# Patient Record
Sex: Female | Born: 1985 | Race: White | Hispanic: No | Marital: Single | Smoking: Former smoker
Health system: Southern US, Community
[De-identification: ages and names within clinical notes are randomized; demographics above are authoritative.]

## PROBLEM LIST (undated history)

## (undated) ENCOUNTER — Inpatient Hospital Stay (HOSPITAL_COMMUNITY): Payer: Self-pay

## (undated) DIAGNOSIS — K429 Umbilical hernia without obstruction or gangrene: Secondary | ICD-10-CM

## (undated) DIAGNOSIS — O00109 Unspecified tubal pregnancy without intrauterine pregnancy: Secondary | ICD-10-CM

## (undated) DIAGNOSIS — J020 Streptococcal pharyngitis: Secondary | ICD-10-CM

## (undated) DIAGNOSIS — R87619 Unspecified abnormal cytological findings in specimens from cervix uteri: Secondary | ICD-10-CM

## (undated) DIAGNOSIS — N83209 Unspecified ovarian cyst, unspecified side: Secondary | ICD-10-CM

## (undated) DIAGNOSIS — IMO0002 Reserved for concepts with insufficient information to code with codable children: Secondary | ICD-10-CM

## (undated) HISTORY — PX: LAPAROSCOPY FOR ECTOPIC PREGNANCY: SUR765

## (undated) HISTORY — PX: APPENDECTOMY: SHX54

## (undated) HISTORY — PX: LEEP: SHX91

---
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
## 2004-09-25 ENCOUNTER — Other Ambulatory Visit: Admission: RE | Admit: 2004-09-25 | Discharge: 2004-09-25 | Payer: Self-pay | Admitting: Obstetrics and Gynecology

## 2004-12-23 ENCOUNTER — Inpatient Hospital Stay (HOSPITAL_COMMUNITY): Admission: AD | Admit: 2004-12-23 | Discharge: 2004-12-23 | Payer: Self-pay | Admitting: Obstetrics and Gynecology

## 2005-01-07 ENCOUNTER — Other Ambulatory Visit: Admission: RE | Admit: 2005-01-07 | Discharge: 2005-01-07 | Payer: Self-pay | Admitting: Obstetrics and Gynecology

## 2005-01-08 ENCOUNTER — Other Ambulatory Visit: Admission: RE | Admit: 2005-01-08 | Discharge: 2005-01-08 | Payer: Self-pay | Admitting: Obstetrics and Gynecology

## 2011-10-10 ENCOUNTER — Emergency Department (HOSPITAL_COMMUNITY): Payer: Self-pay

## 2011-10-10 ENCOUNTER — Emergency Department (HOSPITAL_COMMUNITY)
Admission: EM | Admit: 2011-10-10 | Discharge: 2011-10-11 | Disposition: A | Discharge status: Home / Self Care | Payer: Self-pay | Attending: Emergency Medicine | Admitting: Emergency Medicine

## 2011-10-10 ENCOUNTER — Encounter (HOSPITAL_COMMUNITY): Payer: Self-pay | Admitting: *Deleted

## 2011-10-10 DIAGNOSIS — F17200 Nicotine dependence, unspecified, uncomplicated: Secondary | ICD-10-CM | POA: Insufficient documentation

## 2011-10-10 DIAGNOSIS — N949 Unspecified condition associated with female genital organs and menstrual cycle: Secondary | ICD-10-CM | POA: Insufficient documentation

## 2011-10-10 DIAGNOSIS — R599 Enlarged lymph nodes, unspecified: Secondary | ICD-10-CM | POA: Insufficient documentation

## 2011-10-10 DIAGNOSIS — R5381 Other malaise: Secondary | ICD-10-CM | POA: Insufficient documentation

## 2011-10-10 DIAGNOSIS — J020 Streptococcal pharyngitis: Secondary | ICD-10-CM | POA: Insufficient documentation

## 2011-10-10 DIAGNOSIS — M542 Cervicalgia: Secondary | ICD-10-CM | POA: Insufficient documentation

## 2011-10-10 DIAGNOSIS — R109 Unspecified abdominal pain: Secondary | ICD-10-CM | POA: Insufficient documentation

## 2011-10-10 DIAGNOSIS — R102 Pelvic and perineal pain: Secondary | ICD-10-CM

## 2011-10-10 HISTORY — DX: Streptococcal pharyngitis: J02.0

## 2011-10-10 LAB — POCT PREGNANCY, URINE: Preg Test, Ur: NEGATIVE

## 2011-10-10 LAB — URINALYSIS, ROUTINE W REFLEX MICROSCOPIC
Glucose, UA: NEGATIVE mg/dL
Hgb urine dipstick: NEGATIVE
Specific Gravity, Urine: 1.022 (ref 1.005–1.030)
Urobilinogen, UA: 1 mg/dL (ref 0.0–1.0)

## 2011-10-10 MED ORDER — HYDROCODONE-ACETAMINOPHEN 7.5-500 MG/15ML PO SOLN: 15.0000 mL | Freq: Four times a day (QID) | ORAL | Status: AC | PRN

## 2011-10-10 MED ORDER — HYDROCODONE-ACETAMINOPHEN 5-325 MG PO TABS
1.0000 | ORAL_TABLET | Freq: Once | ORAL | Status: AC
  Administered 2011-10-10: 1 via ORAL
  Filled 2011-10-10: qty 1

## 2011-10-10 MED ORDER — AMOXICILLIN 500 MG PO CAPS: 500.0000 mg | ORAL_CAPSULE | Freq: Three times a day (TID) | ORAL | Status: AC

## 2011-10-10 MED ORDER — DEXAMETHASONE SODIUM PHOSPHATE 10 MG/ML IJ SOLN
10.0000 mg | Freq: Once | INTRAMUSCULAR | Status: AC
  Administered 2011-10-10: 10 mg via INTRAMUSCULAR
  Filled 2011-10-10: qty 1

## 2011-10-10 NOTE — Discharge Instructions (Signed)
Strep Infections Streptococcal (strep) infections are caused by streptococcal germs (bacteria). Strep infections are very contagious. Strep infections can occur in:  Ears.   The nose.   The throat.   Sinuses.   Skin.   Blood.   Lungs.   Spinal fluid.   Urine.  Strep throat is the most common bacterial infection in children. The symptoms of a Strep infection usually get better in 2 to 3 days after starting medicine that kills germs (antibiotics). Strep is usually not contagious after 36 to 48 hours of antibiotic treatment. Strep infections that are not treated can cause serious complications. These include gland infections, throat abscess, rheumatic fever and kidney disease. DIAGNOSIS  The diagnosis of strep is made by:  A culture for the strep germ.  TREATMENT  These infections require oral antibiotics for a full 10 days, an antibiotic shot or antibiotics given into the vein (intravenous, IV). HOME CARE INSTRUCTIONS   Be sure to finish all antibiotics even if feeling better.   Only take over-the-counter medicines for pain, discomfort and or fever, as directed by your caregiver.   Close contacts that have a fever, sore throat or illness symptoms should see their caregiver right away.   You or your child may return to work, school or daycare if the fever and pain are better in 2 to 3 days after starting antibiotics.  SEEK MEDICAL CARE IF:   You or your child has an oral temperature above 102 F (38.9 C).   Your baby is older than 3 months with a rectal temperature of 100.5 F (38.1 C) or higher for more than 1 day.   You or your child is not better in 3 days.  SEEK IMMEDIATE MEDICAL CARE IF:   You or your child has an oral temperature above 102 F (38.9 C), not controlled by medicine.   Your baby is older than 3 months with a rectal temperature of 102 F (38.9 C) or higher.   Your baby is 21 months old or younger with a rectal temperature of 100.4 F (38 C) or  higher.   There is a spreading rash.   There is difficulty swallowing or breathing.   There is increased pain or swelling.  Document Released: 09/10/2004 Document Revised: 04/15/2011 Document Reviewed: 06/19/2009 [facility] Patient Information 2012 [place], [place].Salt Water Gargle This solution will help make your mouth and throat feel better. HOME CARE INSTRUCTIONS   Mix 1 teaspoon of salt in 8 ounces of warm water.   Gargle with this solution as much or often as you need or as directed. Swish and gargle gently if you have any sores or wounds in your mouth.   Do not swallow this mixture.  Document Released: 05/07/2004 Document Revised: 04/15/2011 Document Reviewed: 09/28/2008 [facility] Patient Information 2012 [place], [place].

## 2011-10-10 NOTE — ED Notes (Signed)
Lab called me to notify me that strep screen which was read as negative after has in fact now turned POSITIVE.   Further testing will be done

## 2011-10-10 NOTE — ED Provider Notes (Signed)
History     CSN: [id number]  Arrival date & time 10/10/11  1906   First MD Initiated Contact with Patient 10/10/11 2047      Chief Complaint  Patient presents with  . Sore Throat    (Consider location/radiation/quality/duration/timing/severity/associated sxs/prior treatment) HPI Comments: Patient here with two complaints - states that she has a history of strep pharyngitis and she thinks that she has it again - she is concerned because of the amount of pain she is having and she is concerned that she may have throat cancer - denies fever, chills, nausea, vomiting - reports pain with swallowing - also states that she has a history of ovarian cysts and thinks that she may have another one of those - states bilateral pelvic pain - denies fever, chills, vaginal discharge, dysuria, hematuria.  Patient is a 26 y.o. female presenting with pharyngitis and abdominal pain. The history is provided by the patient. No language interpreter was used.  Sore Throat This is a recurrent problem. The current episode started in the past 7 days. The problem occurs constantly. The problem has been rapidly worsening. Associated symptoms include abdominal pain, anorexia, fatigue, neck pain, a sore throat and swollen glands. Pertinent negatives include no arthralgias, change in bowel habit, chest pain, chills, congestion, coughing, diaphoresis, fever, headaches, joint swelling, myalgias, nausea, numbness, rash, urinary symptoms, vertigo, visual change, vomiting or weakness. The symptoms are aggravated by swallowing. She has tried nothing for the symptoms. The treatment provided no relief.  Abdominal Pain The primary symptoms of the illness include abdominal pain and fatigue. The primary symptoms of the illness do not include fever, shortness of breath, nausea, vomiting, diarrhea, hematemesis, hematochezia, dysuria, vaginal discharge or vaginal bleeding. The current episode started more than 2 days ago. The onset of the  illness was gradual. The problem has not changed since onset. The patient states that she believes she is currently not pregnant. The patient has not had a change in bowel habit. Additional symptoms associated with the illness include anorexia. Symptoms associated with the illness do not include chills, diaphoresis, heartburn, constipation, urgency, hematuria, frequency or back pain.    Past Medical History  Diagnosis Date  . Strep throat     Past Surgical History  Procedure Date  . Appendectomy     No family history on file.  History  Substance Use Topics  . Smoking status: Current Everyday Smoker -- 0.5 packs/day    Types: Cigarettes  . Smokeless tobacco: Not on file  . Alcohol Use: No    OB History    Grav Para Term Preterm Abortions TAB SAB Ect Mult Living                  Review of Systems  Constitutional: Positive for fatigue. Negative for fever, chills and diaphoresis.  HENT: Positive for sore throat, trouble swallowing and neck pain. Negative for congestion.   Respiratory: Negative for cough and shortness of breath.   Cardiovascular: Negative for chest pain.  Gastrointestinal: Positive for abdominal pain and anorexia. Negative for heartburn, nausea, vomiting, diarrhea, constipation, hematochezia, change in bowel habit and hematemesis.  Genitourinary: Positive for pelvic pain. Negative for dysuria, urgency, frequency, hematuria, vaginal bleeding and vaginal discharge.  Musculoskeletal: Negative for myalgias, back pain, joint swelling and arthralgias.  Skin: Negative for rash.  Neurological: Negative for vertigo, weakness, numbness and headaches.  All other systems reviewed and are negative.    Allergies  Review of patient's allergies indicates no known allergies.  Home  Medications   Current Outpatient Rx  Name Route Sig Dispense Refill  . EXCEDRIN EXTRA STRENGTH PO Oral Take 2 tablets by mouth daily as needed.      BP 119/58  Pulse 82  Temp(Src) 98.1 F  (36.7 C) (Oral)  Resp 16  SpO2 100%  LMP 09/14/2011  Physical Exam  Nursing note and vitals reviewed. Constitutional: She is oriented to person, place, and time. She appears well-developed and well-nourished. No distress.  HENT:  Head: Normocephalic and atraumatic.  Right Ear: External ear normal.  Left Ear: External ear normal.  Nose: Nose normal.  Mouth/Throat: No oropharyngeal exudate.       Mild posterior pharynx erythema  Eyes: Conjunctivae are normal. Pupils are equal, round, and reactive to light. No scleral icterus.  Neck: Normal range of motion. Neck supple.  Cardiovascular: Normal rate, regular rhythm and normal heart sounds.  Exam reveals no gallop and no friction rub.   No murmur heard. Pulmonary/Chest: Effort normal. No respiratory distress. She exhibits no tenderness.  Abdominal: Soft. Bowel sounds are normal. She exhibits no distension and no mass. There is tenderness in the suprapubic area. There is no rebound and no guarding.    Genitourinary: Vagina normal. There is no rash or tenderness on the right labia. There is no rash or tenderness on the left labia. Uterus is tender. Uterus is not deviated. Cervix exhibits no motion tenderness, no discharge and no friability. Right adnexum displays tenderness. Right adnexum displays no mass. Left adnexum displays tenderness. Left adnexum displays no mass. No tenderness around the vagina. No vaginal discharge found.  Musculoskeletal: Normal range of motion. She exhibits no edema and no tenderness.  Lymphadenopathy:    She has no cervical adenopathy.  Neurological: She is alert and oriented to person, place, and time. No cranial nerve deficit.  Skin: Skin is warm and dry. No rash noted. No erythema. No pallor.  Psychiatric: She has a normal mood and affect. Her behavior is normal. Judgment and thought content normal.    ED Course  Procedures (including critical care time)  Labs Reviewed  WET PREP, GENITAL - Abnormal;  Notable for the following:    WBC, Wet Prep HPF POC FEW (*)    All other components within normal limits  URINALYSIS, ROUTINE W REFLEX MICROSCOPIC - Abnormal; Notable for the following:    APPearance CLOUDY (*)    All other components within normal limits  RAPID STREP SCREEN  POCT PREGNANCY, URINE  STREP A DNA PROBE  GC/CHLAMYDIA PROBE AMP, GENITAL   US Transvaginal Non-ob  10/10/2011  *RADIOLOGY REPORT*  Clinical Data: Left pelvic pain.  TRANSABDOMINAL AND TRANSVAGINAL ULTRASOUND OF PELVIS  Technique:  Both transabdominal and transvaginal ultrasound examinations of the pelvis were performed including evaluation of the uterus, ovaries, adnexal regions, and pelvic cul-de-sac.  Comparison: None.  Findings:  Uterus: 8.2 x 4.6 x 5.3 cm.  Normal echotexture.  No focal abnormality.  Endometrium: Normal appearance and thickness, 10 mm.  Right Ovary: 4.1 x 2.7 x 4.3 cm.  Multiple small follicles. Normal size and echotexture.  No adnexal masses.  Left Ovary: 3.7 x 2.1 x 3.7 cm.  Multiple small follicles. Normal size and echotexture.  No adnexal masses.  Other Findings:  Trace free fluid in the pelvis.  IMPRESSION: Unremarkable study.  Original Report Authenticated By: Cyndie Chime, M.D.   US Pelvis Complete  10/10/2011  *RADIOLOGY REPORT*  Clinical Data: Left pelvic pain.  TRANSABDOMINAL AND TRANSVAGINAL ULTRASOUND OF PELVIS  Technique:  Both transabdominal and transvaginal ultrasound examinations of the pelvis were performed including evaluation of the uterus, ovaries, adnexal regions, and pelvic cul-de-sac.  Comparison: None.  Findings:  Uterus: 8.2 x 4.6 x 5.3 cm.  Normal echotexture.  No focal abnormality.  Endometrium: Normal appearance and thickness, 10 mm.  Right Ovary: 4.1 x 2.7 x 4.3 cm.  Multiple small follicles. Normal size and echotexture.  No adnexal masses.  Left Ovary: 3.7 x 2.1 x 3.7 cm.  Multiple small follicles. Normal size and echotexture.  No adnexal masses.  Other Findings:  Trace free  fluid in the pelvis.  IMPRESSION: Unremarkable study.  Original Report Authenticated By: Cyndie Chime, M.D.     Strep pharyngitis Pelvic pain    MDM  Patient here with sore throat - lab called and reports that the strep is actually positive - given an injection of steroids as well - also with pelvic pain - no discharge so I do not suspect PID, ultrasound without evidence of pelvic abnormalitty - will place on oral abx and pain medication for short course - she will follow up with ENT and own GYN        Jade Hancock C. [place], [place] 10/10/11 2349

## 2011-10-10 NOTE — ED Notes (Signed)
Pt with hx of strep in December is here with sore throat.  No sob, no fever or chills.

## 2011-10-11 LAB — STREP A DNA PROBE
Group A Strep Probe: POSITIVE
Special Requests: NORMAL

## 2011-10-13 LAB — GC/CHLAMYDIA PROBE AMP, GENITAL: GC Probe Amp, Genital: NEGATIVE

## 2011-10-15 NOTE — ED Provider Notes (Signed)
Medical screening examination/treatment/procedure(s) were performed by non-physician practitioner and as supervising physician I was immediately available for consultation/collaboration.  Shevon Sian, MD 10/15/11 1003 

## 2011-11-29 ENCOUNTER — Emergency Department (HOSPITAL_COMMUNITY)
Admission: EM | Admit: 2011-11-29 | Discharge: 2011-11-29 | Disposition: A | Discharge status: Home / Self Care | Payer: Self-pay | Attending: Emergency Medicine | Admitting: Emergency Medicine

## 2011-11-29 ENCOUNTER — Encounter (HOSPITAL_COMMUNITY): Payer: Self-pay | Admitting: *Deleted

## 2011-11-29 DIAGNOSIS — F17200 Nicotine dependence, unspecified, uncomplicated: Secondary | ICD-10-CM | POA: Insufficient documentation

## 2011-11-29 DIAGNOSIS — K0889 Other specified disorders of teeth and supporting structures: Secondary | ICD-10-CM

## 2011-11-29 DIAGNOSIS — K089 Disorder of teeth and supporting structures, unspecified: Secondary | ICD-10-CM | POA: Insufficient documentation

## 2011-11-29 MED ORDER — PENICILLIN V POTASSIUM 500 MG PO TABS: 500.0000 mg | ORAL_TABLET | Freq: Four times a day (QID) | ORAL | Status: AC

## 2011-11-29 MED ORDER — NAPROXEN 500 MG PO TABS: 500.0000 mg | ORAL_TABLET | Freq: Two times a day (BID) | ORAL | Status: DC

## 2011-11-29 MED ORDER — OXYCODONE-ACETAMINOPHEN 5-325 MG PO TABS
2.0000 | ORAL_TABLET | Freq: Once | ORAL | Status: AC
  Administered 2011-11-29: 2 via ORAL
  Filled 2011-11-29: qty 2

## 2011-11-29 NOTE — Discharge Instructions (Signed)
Dental Pain A tooth ache may be caused by cavities (tooth decay). Cavities expose the nerve of the tooth to air and hot or cold temperatures. It may come from an infection or abscess (also called a boil or furuncle) around your tooth. It is also often caused by dental caries (tooth decay). This causes the pain you are having. DIAGNOSIS  Your caregiver can diagnose this problem by exam. TREATMENT   If caused by an infection, it may be treated with medications which kill germs (antibiotics) and pain medications as prescribed by your caregiver. Take medications as directed.   Only take over-the-counter or prescription medicines for pain, discomfort, or fever as directed by your caregiver.   Whether the tooth ache today is caused by infection or dental disease, you should see your dentist as soon as possible for further care.  SEEK MEDICAL CARE IF: The exam and treatment you received today has been provided on an emergency basis only. This is not a substitute for complete medical or dental care. If your problem worsens or new problems (symptoms) appear, and you are unable to meet with your dentist, call or return to this location. SEEK IMMEDIATE MEDICAL CARE IF:   You have a fever.   You develop redness and swelling of your face, jaw, or neck.   You are unable to open your mouth.   You have severe pain uncontrolled by pain medicine.  MAKE SURE YOU:   Understand these instructions.   Will watch your condition.   Will get help right away if you are not doing well or get worse.  Document Released: 08/03/2005 Document Revised: 07/23/2011 Document Reviewed: 03/21/2008 [facility] Patient Information 2012 [place], [place].Dental Pain A tooth ache may be caused by cavities (tooth decay). Cavities expose the nerve of the tooth to air and hot or cold temperatures. It may come from an infection or abscess (also called a boil or furuncle) around your tooth. It is also often caused by dental caries (tooth  decay). This causes the pain you are having. DIAGNOSIS  Your caregiver can diagnose this problem by exam. TREATMENT   If caused by an infection, it may be treated with medications which kill germs (antibiotics) and pain medications as prescribed by your caregiver. Take medications as directed.   Only take over-the-counter or prescription medicines for pain, discomfort, or fever as directed by your caregiver.   Whether the tooth ache today is caused by infection or dental disease, you should see your dentist as soon as possible for further care.  SEEK MEDICAL CARE IF: The exam and treatment you received today has been provided on an emergency basis only. This is not a substitute for complete medical or dental care. If your problem worsens or new problems (symptoms) appear, and you are unable to meet with your dentist, call or return to this location. SEEK IMMEDIATE MEDICAL CARE IF:   You have a fever.   You develop redness and swelling of your face, jaw, or neck.   You are unable to open your mouth.   You have severe pain uncontrolled by pain medicine.  MAKE SURE YOU:   Understand these instructions.   Will watch your condition.   Will get help right away if you are not doing well or get worse.  Document Released: 08/03/2005 Document Revised: 07/23/2011 Document Reviewed: 03/21/2008 [facility] Patient Information 2012 [place], [place].Dental Pain A tooth ache may be caused by cavities (tooth decay). Cavities expose the nerve of the tooth to air and hot  or cold temperatures. It may come from an infection or abscess (also called a boil or furuncle) around your tooth. It is also often caused by dental caries (tooth decay). This causes the pain you are having. DIAGNOSIS  Your caregiver can diagnose this problem by exam. TREATMENT   If caused by an infection, it may be treated with medications which kill germs (antibiotics) and pain medications as prescribed by your caregiver. Take  medications as directed.   Only take over-the-counter or prescription medicines for pain, discomfort, or fever as directed by your caregiver.   Whether the tooth ache today is caused by infection or dental disease, you should see your dentist as soon as possible for further care.  SEEK MEDICAL CARE IF: The exam and treatment you received today has been provided on an emergency basis only. This is not a substitute for complete medical or dental care. If your problem worsens or new problems (symptoms) appear, and you are unable to meet with your dentist, call or return to this location. SEEK IMMEDIATE MEDICAL CARE IF:   You have a fever.   You develop redness and swelling of your face, jaw, or neck.   You are unable to open your mouth.   You have severe pain uncontrolled by pain medicine.  MAKE SURE YOU:   Understand these instructions.   Will watch your condition.   Will get help right away if you are not doing well or get worse.  Document Released: 08/03/2005 Document Revised: 07/23/2011 Document Reviewed: 03/21/2008 [facility] Patient Information 2012 [place], [place].  RESOURCE GUIDE  Dental Problems  Patients with Medicaid: [facility] [phone] [address]                                           [phone] Phone:  [phone]                                                  Phone:  [phone]  If unable to pay or uninsured, contact:  [facility] or [facility]. to become qualified for the adult dental clinic.  Chronic Pain Problems Contact [facility]  [phone] Patients need to be referred by their primary care doctor.  Insufficient Money for Medicine Contact [employer]:  call "211" or [facility] 606-826-9076.  No Primary Care Doctor Call [facility]  [phone] Other agencies that provide inexpensive medical care    [facility]  [phone]    [facility]  Internal Medicine  [phone]    [facility]  [phone]    [facility]  [phone]    [facility]  [phone]    [facility]  [phone]  Psychological Services [facility]  [phone] [facility]  [phone] [facility]   [phone] (emergency services [phone])  Substance Abuse Resources [facility]  [phone] [facility] [phone] [facility] [phone] Floydene Flock [phone] Residential & Outpatient Substance Abuse Program  587-318-5934  Abuse/Neglect [facility] Child Abuse Hotline 762-811-5836 [facility] Child Abuse  Hotline (734) 133-9551 (After Hours)  Emergency Shelter [facility] 803-068-9823  M[facility] [place] [facility] 3216995060 [facility] Services 440-318-4771  MRSA Hotline #:   (867)177-5325    [facility] Resources  [facility] [place]     [employer]                          [facility] Dept. [address] [place]                       [address]      [address]  Blondell Reveal Phone:  [phone]                                   Phone:  [phone]                 Phone:  [phone]  [facility] Mental Health Phone:  [phone]  [facility] Child Abuse Hotline (403)708-0937 531-311-7145 (After Hours)

## 2011-11-29 NOTE — ED Notes (Signed)
Toothache for 2 days worse tonight

## 2011-11-29 NOTE — ED Provider Notes (Signed)
History     CSN: [id number]  Arrival date & time 11/29/11  0114   First MD Initiated Contact with Patient 11/29/11 0355      Chief Complaint  Patient presents with  . Dental Pain    (Consider location/radiation/quality/duration/timing/severity/associated sxs/prior treatment) HPI Comments: Patient complains of upper left toothache which has been ongoing for the last 2 days. Symptoms are persistent, worse with chewing and temperature, not associated with fever chills nausea or vomiting. She has not seen a dentist for her symptoms. There were gradual in onset  Patient is a 26 y.o. female presenting with tooth pain. The history is provided by the patient.  Dental PainPrimary symptoms do not include fever or sore throat.  Additional symptoms do not include: facial swelling and trouble swallowing.    Past Medical History  Diagnosis Date  . Strep throat     Past Surgical History  Procedure Date  . Appendectomy     No family history on file.  History  Substance Use Topics  . Smoking status: Current Everyday Smoker -- 0.5 packs/day    Types: Cigarettes  . Smokeless tobacco: Not on file  . Alcohol Use: No    OB History    Grav Para Term Preterm Abortions TAB SAB Ect Mult Living                  Review of Systems  Constitutional: Negative for fever and chills.  HENT: Positive for dental problem. Negative for sore throat, facial swelling, trouble swallowing and voice change.        Toothache  Gastrointestinal: Negative for nausea and vomiting.    Allergies  Review of patient's allergies indicates no known allergies.  Home Medications   Current Outpatient Rx  Name Route Sig Dispense Refill  . IBUPROFEN 200 MG PO TABS Oral Take 800 mg by mouth every 8 (eight) hours as needed. For pain    . NAPROXEN 500 MG PO TABS Oral Take 1 tablet (500 mg total) by mouth 2 (two) times daily with a meal. 30 tablet 0  . PENICILLIN V POTASSIUM 500 MG PO TABS Oral Take 1 tablet (500 mg  total) by mouth 4 (four) times daily. 40 tablet 0    BP 114/75  Pulse 72  Temp(Src) 98.1 F (36.7 C) (Oral)  Resp 20  SpO2 100%  Physical Exam  Nursing note and vitals reviewed. Constitutional: She appears well-developed and well-nourished. No distress.  HENT:  Head: Normocephalic and atraumatic.  Mouth/Throat: Oropharynx is clear and moist. No oropharyngeal exudate.       Dental Disease - L upper rear tooth with severe disease, no signs of abscess, no asymeetry of jaw.  Eyes: Conjunctivae are normal. No scleral icterus.  Neck: Normal range of motion. Neck supple. No thyromegaly present.  Cardiovascular: Normal rate and regular rhythm.   Pulmonary/Chest: Effort normal and breath sounds normal.  Lymphadenopathy:    She has no cervical adenopathy.  Neurological: She is alert.  Skin: Skin is warm and dry. No rash noted. She is not diaphoretic.    ED Course  Procedures (including critical care time)  Labs Reviewed - No data to display No results found.   1. Toothache       MDM  Well appaering, dental pain, no obvious abscess.  2 X percocet Discharge Prescriptions include:  Naprosyn pcn.        Vida Roller, MD 11/29/11 [phone]

## 2012-03-06 ENCOUNTER — Encounter (HOSPITAL_COMMUNITY): Payer: Self-pay | Admitting: Physical Medicine and Rehabilitation

## 2012-03-06 ENCOUNTER — Emergency Department (HOSPITAL_COMMUNITY)
Admission: EM | Admit: 2012-03-06 | Discharge: 2012-03-07 | Disposition: A | Discharge status: Home / Self Care | Payer: Self-pay | Attending: Emergency Medicine | Admitting: Emergency Medicine

## 2012-03-06 DIAGNOSIS — N949 Unspecified condition associated with female genital organs and menstrual cycle: Secondary | ICD-10-CM | POA: Insufficient documentation

## 2012-03-06 DIAGNOSIS — R109 Unspecified abdominal pain: Secondary | ICD-10-CM | POA: Insufficient documentation

## 2012-03-06 LAB — BASIC METABOLIC PANEL
GFR calc Af Amer: >90 mL/min (ref >90)
GFR calc non Af Amer: >90 mL/min (ref >90)
Glucose, Bld: 80 mg/dL (ref 70–99)
Potassium: 3.9 mEq/L (ref 3.5–5.3)
Sodium: 137 mEq/L (ref 135–145)

## 2012-03-06 LAB — CBC WITH DIFFERENTIAL/PLATELET
Basophils Absolute: 0 10*3/uL (ref 0.0–0.1)
Basophils Relative: 0 % (ref 0–1)
Eosinophils Absolute: 0 10*3/uL (ref 0.0–0.7)
Lymphs Abs: 2 10*3/uL (ref 0.7–4.0)
MCH: 31.4 pg (ref 26.0–34.0)
Neutrophils Relative %: 57 % (ref 43–77)
Platelets: 285 10*3/uL (ref 150–400)
RBC: 4.14 MIL/uL (ref 3.87–5.11)
RDW: 12.9 % (ref 11.5–15.5)

## 2012-03-06 LAB — URINALYSIS, ROUTINE W REFLEX MICROSCOPIC
Ketones, ur: NEGATIVE mg/dL
Leukocytes, UA: NEGATIVE
Nitrite: NEGATIVE
Specific Gravity, Urine: 1.007 (ref 1.005–1.030)
pH: 7 (ref 5.0–8.0)

## 2012-03-06 MED ORDER — ONDANSETRON HCL 4 MG/2ML IJ SOLN
INTRAMUSCULAR | Status: AC
  Filled 2012-03-06: qty 2

## 2012-03-06 MED ORDER — ONDANSETRON HCL 4 MG/2ML IJ SOLN
4.0000 mg | INTRAMUSCULAR | Status: AC
  Administered 2012-03-06: 4 mg via INTRAVENOUS
  Filled 2012-03-06: qty 2

## 2012-03-06 MED ORDER — MORPHINE SULFATE 4 MG/ML IJ SOLN
INTRAMUSCULAR | Status: AC
  Filled 2012-03-06: qty 1

## 2012-03-06 MED ORDER — AZITHROMYCIN 250 MG PO TABS
ORAL_TABLET | ORAL | Status: AC
  Filled 2012-03-06: qty 4

## 2012-03-06 MED ORDER — LIDOCAINE HCL (PF) 1 % IJ SOLN
INTRAMUSCULAR | Status: AC
  Filled 2012-03-06: qty 5

## 2012-03-06 MED ORDER — MORPHINE SULFATE 4 MG/ML IJ SOLN
4.0000 mg | Freq: Once | INTRAMUSCULAR | Status: AC
  Administered 2012-03-06: 4 mg via INTRAVENOUS
  Filled 2012-03-06: qty 1

## 2012-03-06 MED ORDER — CEFTRIAXONE SODIUM 250 MG IJ SOLR
INTRAMUSCULAR | Status: AC
  Filled 2012-03-06: qty 250

## 2012-03-06 NOTE — ED Notes (Signed)
Pt presents to department for evaluation of lower abdominal pain. Ongoing x2 days. Pt states intense pressure to stomach after urination. Denies vaginal symptoms. 8/10 pain at the time. No nausea/vomiting. No fever. States she has history of ovarian cysts. She is alert and oriented x4. LMP: 02/23/12

## 2012-03-06 NOTE — ED Provider Notes (Signed)
History     CSN: [id number]  Arrival date & time 03/06/12  1330   First MD Initiated Contact with Patient 03/06/12 1518      Chief Complaint  Patient presents with  . Abdominal Pain    (Consider location/radiation/quality/duration/timing/severity/associated sxs/prior treatment) The history is provided by the patient.    26 y/o female in emotional distress c/o severe constant bilateral lower abdominal pain x2 days. Denies fever, Vomitting and change in bowel  or bladder habits, vaginal discharge, or rash.  reports mild nausea. LMP 7/9. Pain is worsened after urination or when stretching, no alleviating factors identified. [id number]. Pt has h/o ovarian cysts and has had dyspareunia x1 year.  H/o right ectopic with surgical intervention in 2005. She is worried about cervical CA had high grade dysplasia with LEEP in 2009, has not seen Ob since then because she is uninsured. Pt also reports early satiety x3 months.   Past Medical History  Diagnosis Date  . Strep throat     Past Surgical History  Procedure Date  . Appendectomy     No family history on file.  History  Substance Use Topics  . Smoking status: Current Everyday Smoker -- 0.5 packs/day    Types: Cigarettes  . Smokeless tobacco: Not on file  . Alcohol Use: No    OB History    Grav Para Term Preterm Abortions TAB SAB Ect Mult Living                  Review of Systems  Constitutional: Negative for fever and unexpected weight change.  Respiratory: Negative for shortness of breath.   Cardiovascular: Negative for chest pain and leg swelling.  Gastrointestinal: Positive for nausea and abdominal pain. Negative for vomiting, diarrhea and constipation.  Genitourinary: Positive for dyspareunia. Negative for dysuria and urgency.  Skin: Negative for rash.  Neurological: Negative for weakness.  Psychiatric/Behavioral: Negative for agitation.  All other systems reviewed and are negative.    Allergies  Review of patient's  allergies indicates no known allergies.  Home Medications   Current Outpatient Rx  Name Route Sig Dispense Refill  . ACETAMINOPHEN 500 MG PO TABS Oral Take 2,000 mg by mouth every 6 (six) hours as needed. For pain    . IBUPROFEN 200 MG PO TABS Oral Take 800 mg by mouth every 8 (eight) hours as needed. For pain      BP 112/64  Pulse 85  Temp 98 F (36.7 C) (Oral)  Resp 18  SpO2 99%  Physical Exam  Nursing note and vitals reviewed. Constitutional: She is oriented to person, place, and time. She appears well-developed and well-nourished. No distress.  HENT:  Head: Normocephalic.  Eyes: Conjunctivae and EOM are normal.  Cardiovascular: Normal rate, regular rhythm and normal heart sounds.   Pulmonary/Chest: Effort normal.  Abdominal: Soft. Bowel sounds are normal. She exhibits no distension and no mass. There is tenderness. There is no rebound and no guarding.       Mild Bilateral lower quadrant tenderness to palpation  Genitourinary: Vagina normal and uterus normal. Pelvic exam was performed with patient supine. There is no rash, tenderness, lesion or injury on the right labia. There is no rash, tenderness, lesion or injury on the left labia. Cervix exhibits no motion tenderness, no discharge and no friability. Right adnexum displays no mass, no tenderness and no fullness. Left adnexum displays no mass, no tenderness and no fullness. No vaginal discharge found.       No CVA tenderness  bilaterally  Musculoskeletal: Normal range of motion.  Neurological: She is alert and oriented to person, place, and time.  Skin: Skin is warm and dry.  Psychiatric: She has a normal mood and affect.    ED Course  Procedures (including critical care time)  Labs Reviewed  WET PREP, GENITAL - Abnormal; Notable for the following:    WBC, Wet Prep HPF POC MODERATE (*)     All other components within normal limits  URINALYSIS, ROUTINE W REFLEX MICROSCOPIC  POCT PREGNANCY, URINE  CBC WITH DIFFERENTIAL    BASIC METABOLIC PANEL  GC/CHLAMYDIA PROBE AMP, GENITAL  LAB REPORT - SCANNED  LAB REPORT - SCANNED  WET PREP, GENITAL  GC/CHLAMYDIA PROBE AMP, GENITAL  GC/CHLAMYDIA PROBE AMP, GENITAL  GC/CHLAMYDIA PROBE AMP, GENITAL  GC/CHLAMYDIA PROBE AMP, GENITAL  GC/CHLAMYDIA PROBE AMP, GENITAL  GC/CHLAMYDIA PROBE AMP, GENITAL  GC/CHLAMYDIA PROBE AMP, GENITAL  GC/CHLAMYDIA PROBE AMP, GENITAL  GC/CHLAMYDIA PROBE AMP, GENITAL  GC/CHLAMYDIA PROBE AMP, GENITAL  GC/CHLAMYDIA PROBE AMP, GENITAL  GC/CHLAMYDIA PROBE AMP, GENITAL  GC/CHLAMYDIA PROBE AMP, GENITAL   No results found.   1. Abdominal pain       MDM  26 y/o female presenting with 2 days of bilateral lower abdominal pain. Serial abdominal exams show no peritoneal signs with mild lower quadrant tenderness. UA wnl. Urine pregnancy test negative. Bmet and CBC wnl. Pelvic Shows no CMT or adnexal tenderness, no abnormal cervical discharge. As Pt has no GI symptoms, I do not believe a CT is warranted at this time. Transvaginal US shows no acute abnormalities. I will d/c this Pt with strict return precautions and [facility]         Wynetta Emery, PA-C 03/12/12 1025

## 2012-03-07 LAB — WET PREP, GENITAL: Yeast Wet Prep HPF POC: NONE SEEN

## 2012-03-07 NOTE — ED Notes (Signed)
See downtime charting. 

## 2012-03-08 ENCOUNTER — Other Ambulatory Visit (HOSPITAL_COMMUNITY): Payer: Self-pay | Admitting: Internal Medicine

## 2012-03-08 ENCOUNTER — Ambulatory Visit (HOSPITAL_COMMUNITY)
Admission: RE | Admit: 2012-03-08 | Discharge: 2012-03-08 | Disposition: A | Discharge status: Home / Self Care | Payer: Self-pay | Source: Ambulatory Visit | Attending: Internal Medicine | Admitting: Internal Medicine

## 2012-03-08 DIAGNOSIS — R52 Pain, unspecified: Secondary | ICD-10-CM

## 2012-03-08 DIAGNOSIS — R109 Unspecified abdominal pain: Secondary | ICD-10-CM | POA: Insufficient documentation

## 2012-03-08 LAB — GC/CHLAMYDIA PROBE AMP, GENITAL: Chlamydia, DNA Probe: NEGATIVE

## 2012-03-12 MED ORDER — OXYCODONE-ACETAMINOPHEN 5-325 MG PO TABS: ORAL_TABLET | ORAL | Status: AC

## 2012-03-23 NOTE — ED Provider Notes (Signed)
Medical screening examination/treatment/procedure(s) were performed by non-physician practitioner and as supervising physician I was immediately available for consultation/collaboration.   Swan Zayed, MD 03/23/12 2232 

## 2012-05-10 ENCOUNTER — Emergency Department (HOSPITAL_COMMUNITY)
Admission: EM | Admit: 2012-05-10 | Discharge: 2012-05-10 | Disposition: A | Discharge status: Home / Self Care | Payer: Self-pay | Attending: Emergency Medicine | Admitting: Emergency Medicine

## 2012-05-10 ENCOUNTER — Encounter (HOSPITAL_COMMUNITY): Payer: Self-pay | Admitting: Emergency Medicine

## 2012-05-10 ENCOUNTER — Emergency Department (HOSPITAL_COMMUNITY): Payer: Self-pay

## 2012-05-10 DIAGNOSIS — R110 Nausea: Secondary | ICD-10-CM | POA: Insufficient documentation

## 2012-05-10 DIAGNOSIS — R109 Unspecified abdominal pain: Secondary | ICD-10-CM | POA: Insufficient documentation

## 2012-05-10 DIAGNOSIS — R10815 Periumbilic abdominal tenderness: Secondary | ICD-10-CM | POA: Insufficient documentation

## 2012-05-10 HISTORY — DX: Unspecified ovarian cyst, unspecified side: N83.209

## 2012-05-10 LAB — PREGNANCY, URINE: Preg Test, Ur: NEGATIVE

## 2012-05-10 LAB — URINALYSIS, ROUTINE W REFLEX MICROSCOPIC
Nitrite: NEGATIVE
Specific Gravity, Urine: 1.039 — ABNORMAL HIGH (ref 1.005–1.030)
Urobilinogen, UA: 1 mg/dL (ref 0.0–1.0)

## 2012-05-10 LAB — CBC WITH DIFFERENTIAL/PLATELET
Basophils Relative: 2 % — ABNORMAL HIGH (ref 0–1)
Eosinophils Absolute: 0.2 10*3/uL (ref 0.0–0.7)
Lymphs Abs: 2.8 10*3/uL (ref 0.7–4.0)
MCH: 31.6 pg (ref 26.0–34.0)
MCHC: 33.9 g/dL (ref 30.0–36.0)
Neutrophils Relative %: 36 % — ABNORMAL LOW (ref 43–77)
Platelets: 228 10*3/uL (ref 150–400)
RBC: 4.11 MIL/uL (ref 3.87–5.11)

## 2012-05-10 LAB — BASIC METABOLIC PANEL
GFR calc Af Amer: >90 mL/min (ref >90)
GFR calc non Af Amer: >90 mL/min (ref >90)
Potassium: 3.4 mEq/L — ABNORMAL LOW (ref 3.5–5.1)
Sodium: 137 mEq/L (ref 135–145)

## 2012-05-10 MED ORDER — ONDANSETRON HCL 4 MG/2ML IJ SOLN
4.0000 mg | Freq: Once | INTRAMUSCULAR | Status: AC
  Administered 2012-05-10: 4 mg via INTRAVENOUS
  Filled 2012-05-10: qty 2

## 2012-05-10 MED ORDER — ONDANSETRON HCL 4 MG PO TABS: 4.0000 mg | ORAL_TABLET | Freq: Four times a day (QID) | ORAL | Status: DC

## 2012-05-10 MED ORDER — HYDROMORPHONE HCL PF 1 MG/ML IJ SOLN
1.0000 mg | Freq: Once | INTRAMUSCULAR | Status: AC
  Administered 2012-05-10: 1 mg via INTRAVENOUS
  Filled 2012-05-10: qty 1

## 2012-05-10 MED ORDER — HYDROMORPHONE HCL PF 1 MG/ML IJ SOLN
0.5000 mg | Freq: Once | INTRAMUSCULAR | Status: AC
  Administered 2012-05-10: 0.5 mg via INTRAVENOUS
  Filled 2012-05-10: qty 1

## 2012-05-10 MED ORDER — HYDROCODONE-ACETAMINOPHEN 5-325 MG PO TABS: 1.0000 | ORAL_TABLET | ORAL | Status: DC | PRN

## 2012-05-10 NOTE — ED Provider Notes (Signed)
History     CSN: [id number]  Arrival date & time 05/10/12  0901   First MD Initiated Contact with Patient 05/10/12 [phone]      Chief Complaint  Patient presents with  . Abdominal Pain    (Consider location/radiation/quality/duration/timing/severity/associated sxs/prior treatment) Patient is a 26 y.o. female presenting with abdominal pain. The history is provided by the patient.  Abdominal Pain The primary symptoms of the illness include abdominal pain and nausea. The primary symptoms of the illness do not include fever, shortness of breath, vomiting, hematochezia, dysuria or vaginal discharge.  Associated symptoms comments: Onset yesterday of pain in the umbilical area. 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 Nausea without vomiting. No bloody bowel movements and she continues to move bowel regularly. No fever. .    Past Medical History  Diagnosis Date  . Strep throat   . Ovarian cyst     Past Surgical History  Procedure Date  . Appendectomy     History reviewed. No pertinent family history.  History  Substance Use Topics  . Smoking status: Current Every Day Smoker -- 0.5 packs/day    Types: Cigarettes  . Smokeless tobacco: Not on file  . Alcohol Use: No    OB History    Grav Para Term Preterm Abortions TAB SAB Ect Mult Living                  Review of Systems  Constitutional: Negative for fever.  Respiratory: Negative for shortness of breath.   Gastrointestinal: Positive for nausea and abdominal pain. Negative for vomiting, blood in stool and hematochezia.  Genitourinary: Negative for dysuria and vaginal discharge.    Allergies  Review of patient's allergies indicates no known allergies.  Home Medications   Current Outpatient Rx  Name Route Sig Dispense Refill  . ACETAMINOPHEN 500 MG PO TABS Oral Take 1,000 mg by mouth every 6 (six) hours as needed. For pain    . IBUPROFEN 200 MG PO TABS Oral Take 800 mg by mouth  every 8 (eight) hours as needed. For pain      BP 97/54  Pulse 66  Temp 98.5 F (36.9 C) (Oral)  Resp 16  SpO2 99%  LMP 04/20/2012  Physical Exam  Constitutional: She is oriented to person, place, and time. She appears well-developed and well-nourished. No distress.  Neck: Normal range of motion.  Cardiovascular: Normal rate and regular rhythm.   No murmur heard. Pulmonary/Chest: Effort normal. She has no wheezes. She has no rales.  Abdominal: Soft. There is tenderness.       Tender umbilicus without mass when supine. Small painful nodular lesion when standing. BS active x 4.   Musculoskeletal: She exhibits no edema.  Neurological: She is alert and oriented to person, place, and time.  Skin: Skin is warm and dry.  Psychiatric: She has a normal mood and affect.    ED Course  Procedures (including critical care time)  Labs Reviewed  CBC WITH DIFFERENTIAL - Abnormal; Notable for the following:    Neutrophils Relative 36 (*)     Lymphocytes Relative 51 (*)     Basophils Relative 2 (*)     All other components within normal limits  BASIC METABOLIC PANEL - Abnormal; Notable for the following:    Potassium 3.4 (*)     All other components within normal limits  URINALYSIS, ROUTINE W REFLEX MICROSCOPIC - Abnormal; Notable for the following:  APPearance HAZY (*)     Specific Gravity, Urine 1.039 (*)     Bilirubin Urine SMALL (*)     All other components within normal limits  PREGNANCY, URINE   Ct Abdomen Pelvis Wo Contrast  05/10/2012  *RADIOLOGY REPORT*  Clinical Data: Periumbilical and right lower quadrant abdominal pain with nausea.  History of ovarian cyst.  Question umbilical hernia.  CT ABDOMEN AND PELVIS WITHOUT CONTRAST  Technique:  Multidetector CT imaging of the abdomen and pelvis was performed following the standard protocol without intravenous contrast.  Comparison: Pelvic ultrasound 03/06/2012.  Findings: There is minimal dependent atelectasis at both lung bases.   There is no pleural effusion.  Both kidneys appear normal as imaged in the noncontrast state. There is no evidence of urinary tract calculus, hydronephrosis or perinephric soft tissue stranding.  The liver, spleen, gallbladder, pancreas and adrenal glands also appear unremarkable as imaged in the noncontrast state.  There is moderate stool throughout the colon.  The appendix is difficult to confidently identify on this noncontrast study but may be visualized on coronal image 24.  No pericecal inflammatory process is seen.  Within the left adnexa is a 5.0 cm low density lesion with increased density dependently.  This may reflect hemorrhagic cyst. There is a small amount of free pelvic fluid.  The uterus, right adnexa and bladder appear unremarkable.  There is no evidence of umbilical hernia.  The osseous structures appear normal.  IMPRESSION:  1.  Low-density left adnexal lesion may reflect a hemorrhagic cyst, new from pelvic ultrasound of 2 months ago.  Correlate clinically. Consider pelvic ultrasound followup to document involution. 2.  No evidence of umbilical hernia. 3.  No other acute or significant findings identified on noncontrast imaging.   Original Report Authenticated By: Gerrianne Scale, M.D.      No diagnosis found. 1. Abdominal pain 2. Constipation    MDM  Discussed with radiology regarding allergy to CM (Hives, SOB). Recommended study without contrast, which was negative for visualized hernia. Pain improved with medication. No leukocytosis. Nodular lesion at umbilicus is not fluctuant, no concern for abscess and not visualized on CT (however, large stool burden seen). Will discharge patient home with instructions for re-examination if she has increased pain, fever, bloody stools. Patient comfortable with discharge.        Rodena Medin, PA-C 05/10/12 1434

## 2012-05-10 NOTE — ED Provider Notes (Signed)
Medical screening examination/treatment/procedure(s) were performed by non-physician practitioner and as supervising physician I was immediately available for consultation/collaboration.    Nelia Shi, MD 05/10/12 1434

## 2012-05-10 NOTE — ED Notes (Signed)
Pt c/o generalized mid abd pain starting last night; pt sts felt something bulge out in stomach; pt sts some nausea

## 2012-05-10 NOTE — ED Notes (Addendum)
Pt c/o pain directly above umbilicus and pain in LLQ & RLQ. Pt reports she was working when she felt a popping sound and that is when the pain started. RLQ and LLQ pain started about a week ago, pt has hx of cyst on your left ovary. Pt also c/o nausea.

## 2012-08-17 NOTE — L&D Delivery Note (Signed)
Delivery Note At 9:16 PM a viable and healthy female was delivered via Vaginal, Spontaneous Delivery (Presentation: Left Occiput Anterior).  APGAR: Pending, cyring at perineum , ; weight .   Placenta status: Intact, Spontaneous.  Cord: 3 vessels with the following complications: None.   Anesthesia: Epidural  Episiotomy: None Lacerations: None Suture Repair: NA Est. Blood Loss (mL): 300  Mom to postpartum.  Baby to Couplet care / Skin to Skin.  NSVD over intact perineum complicated with 1:10 shoulder. Improved with suprapubic pressure, mcrobers, and woods screw maneuver. Active managmenet of 3rd stage w/ pit and traction delivered intact placenta w/ 3v cord. No tears, bleeding improved. Hemostatic. 300ebl counts correct.   Tawana Scale 06/25/2013, 9:32 PM

## 2012-08-21 ENCOUNTER — Emergency Department (HOSPITAL_COMMUNITY): Payer: Self-pay

## 2012-08-21 ENCOUNTER — Encounter (HOSPITAL_COMMUNITY): Payer: Self-pay | Admitting: *Deleted

## 2012-08-21 ENCOUNTER — Emergency Department (HOSPITAL_COMMUNITY)
Admission: EM | Admit: 2012-08-21 | Discharge: 2012-08-21 | Disposition: A | Discharge status: Home / Self Care | Payer: Self-pay | Attending: Emergency Medicine | Admitting: Emergency Medicine

## 2012-08-21 DIAGNOSIS — N739 Female pelvic inflammatory disease, unspecified: Secondary | ICD-10-CM

## 2012-08-21 DIAGNOSIS — Z9079 Acquired absence of other genital organ(s): Secondary | ICD-10-CM | POA: Insufficient documentation

## 2012-08-21 DIAGNOSIS — N949 Unspecified condition associated with female genital organs and menstrual cycle: Secondary | ICD-10-CM | POA: Insufficient documentation

## 2012-08-21 DIAGNOSIS — Z3202 Encounter for pregnancy test, result negative: Secondary | ICD-10-CM | POA: Insufficient documentation

## 2012-08-21 DIAGNOSIS — R110 Nausea: Secondary | ICD-10-CM | POA: Insufficient documentation

## 2012-08-21 DIAGNOSIS — F17200 Nicotine dependence, unspecified, uncomplicated: Secondary | ICD-10-CM | POA: Insufficient documentation

## 2012-08-21 DIAGNOSIS — Z9089 Acquired absence of other organs: Secondary | ICD-10-CM | POA: Insufficient documentation

## 2012-08-21 DIAGNOSIS — N83209 Unspecified ovarian cyst, unspecified side: Secondary | ICD-10-CM

## 2012-08-21 LAB — CBC WITH DIFFERENTIAL/PLATELET
Basophils Absolute: 0.1 10*3/uL (ref 0.0–0.1)
Eosinophils Relative: 10 % — ABNORMAL HIGH (ref 0–5)
Lymphocytes Relative: 32 % (ref 12–46)
Neutro Abs: 2.8 10*3/uL (ref 1.7–7.7)
Platelets: 268 10*3/uL (ref 150–400)
RDW: 12.6 % (ref 11.5–15.5)
WBC: 5.6 10*3/uL (ref 4.0–10.5)

## 2012-08-21 LAB — WET PREP, GENITAL
Trich, Wet Prep: NONE SEEN
Yeast Wet Prep HPF POC: NONE SEEN

## 2012-08-21 LAB — BASIC METABOLIC PANEL
CO2: 22 mEq/L (ref 19–32)
Calcium: 9 mg/dL (ref 8.4–10.5)
GFR calc Af Amer: >90 mL/min (ref >90)
Sodium: 137 mEq/L (ref 135–145)

## 2012-08-21 LAB — URINALYSIS, ROUTINE W REFLEX MICROSCOPIC
Glucose, UA: NEGATIVE mg/dL
Hgb urine dipstick: NEGATIVE
Protein, ur: NEGATIVE mg/dL
Specific Gravity, Urine: 1.021 (ref 1.005–1.030)
pH: 5 (ref 5.0–8.0)

## 2012-08-21 MED ORDER — LIDOCAINE HCL (PF) 1 % IJ SOLN
INTRAMUSCULAR | Status: AC
  Administered 2012-08-21: 0.9 mL
  Filled 2012-08-21: qty 5

## 2012-08-21 MED ORDER — METRONIDAZOLE 500 MG PO TABS: 500.0000 mg | ORAL_TABLET | Freq: Two times a day (BID) | ORAL | Status: DC

## 2012-08-21 MED ORDER — DOXYCYCLINE HYCLATE 100 MG PO TABS
100.0000 mg | ORAL_TABLET | Freq: Once | ORAL | Status: AC
  Administered 2012-08-21: 100 mg via ORAL
  Filled 2012-08-21: qty 1

## 2012-08-21 MED ORDER — ONDANSETRON HCL 4 MG/2ML IJ SOLN
4.0000 mg | Freq: Once | INTRAMUSCULAR | Status: AC
  Administered 2012-08-21: 4 mg via INTRAVENOUS
  Filled 2012-08-21: qty 2

## 2012-08-21 MED ORDER — OXYCODONE-ACETAMINOPHEN 5-325 MG PO TABS: 1.0000 | ORAL_TABLET | Freq: Four times a day (QID) | ORAL | Status: DC | PRN

## 2012-08-21 MED ORDER — DOXYCYCLINE HYCLATE 100 MG PO CAPS: 100.0000 mg | ORAL_CAPSULE | Freq: Two times a day (BID) | ORAL | Status: DC

## 2012-08-21 MED ORDER — CEFTRIAXONE SODIUM 250 MG IJ SOLR
250.0000 mg | Freq: Once | INTRAMUSCULAR | Status: AC
  Administered 2012-08-21: 250 mg via INTRAMUSCULAR
  Filled 2012-08-21: qty 250

## 2012-08-21 MED ORDER — HYDROMORPHONE HCL PF 1 MG/ML IJ SOLN
1.0000 mg | Freq: Once | INTRAMUSCULAR | Status: AC
  Administered 2012-08-21: 1 mg via INTRAVENOUS
  Filled 2012-08-21: qty 1

## 2012-08-21 NOTE — ED Notes (Signed)
Patient transported to Ultrasound 

## 2012-08-21 NOTE — ED Notes (Signed)
Pt is here with complaints of RLQ pain that started last nite.  Pt has history of ovarian cysts.  No fever, vomiting or diarrhea.  LMP end of december

## 2012-08-21 NOTE — ED Provider Notes (Signed)
History     CSN: [id number]  Arrival date & time 08/21/12  1236   First MD Initiated Contact with Patient 08/21/12 1253      No chief complaint on file.   (Consider location/radiation/quality/duration/timing/severity/associated sxs/prior treatment) HPI Comments: Patient with history of appendectomy presents with complaint of right lower quadrant abdominal pain it started acutely last evening. Patient reports the pain has been severe at times and is associated with mild nausea but no vomiting. No diarrhea, vaginal bleeding, vaginal discharge, or urinary symptoms. Patient denies fevers. Last menstrual period was a week ago and was normal for her. Patient has several emergency department visits for the same symptoms. She has had pelvic ultrasounds which are were normal. At her last visit she had a noncontrast CT which was normal. Patient had a history of a right sided ectopic pregnancy and had her tubes removed. She has not followed up with a gynecologist because she does not have health insurance. Course is constant. Palpation makes pain worse. Nothing has made it better. Patient states she took 2 ibuprofen 800 mg tablets prior to arrival.  The history is provided by the patient.    Past Medical History  Diagnosis Date  . Strep throat   . Ovarian cyst     Past Surgical History  Procedure Date  . Appendectomy     No family history on file.  History  Substance Use Topics  . Smoking status: Current Every Day Smoker -- 0.5 packs/day    Types: Cigarettes  . Smokeless tobacco: Not on file  . Alcohol Use: No    OB History    Grav Para Term Preterm Abortions TAB SAB Ect Mult Living                  Review of Systems  Constitutional: Negative for fever.  HENT: Negative for sore throat and rhinorrhea.   Eyes: Negative for redness.  Respiratory: Negative for cough.   Cardiovascular: Negative for chest pain.  Gastrointestinal: Positive for nausea and abdominal pain. Negative for  vomiting and diarrhea.  Genitourinary: Positive for pelvic pain. Negative for dysuria, frequency, hematuria, vaginal bleeding and vaginal discharge.  Musculoskeletal: Negative for myalgias.  Skin: Negative for rash.  Neurological: Negative for headaches.    Allergies  Review of patient's allergies indicates no known allergies.  Home Medications   Current Outpatient Rx  Name  Route  Sig  Dispense  Refill  . ACETAMINOPHEN 500 MG PO TABS   Oral   Take 1,000 mg by mouth every 6 (six) hours as needed. For pain           BP 129/58  Pulse 87  Temp 98 F (36.7 C) (Oral)  Resp 18  SpO2 98%  Physical Exam  Nursing note and vitals reviewed. Constitutional: She appears well-developed and well-nourished.  HENT:  Head: Normocephalic and atraumatic.  Eyes: Conjunctivae normal are normal. Right eye exhibits no discharge. Left eye exhibits no discharge.  Neck: Normal range of motion. Neck supple.  Cardiovascular: Normal rate, regular rhythm and normal heart sounds.   Pulmonary/Chest: Effort normal and breath sounds normal.  Abdominal: Soft. Bowel sounds are normal. There is tenderness. There is no rigidity, no rebound, no guarding, no CVA tenderness, no tenderness at McBurney's point and negative Murphy's sign. No hernia.    Genitourinary: Uterus is not tender. Cervix exhibits motion tenderness (mild) and discharge (moderate thick white discharge). Cervix exhibits no friability. Right adnexum displays tenderness. Right adnexum displays no mass and no  fullness. Left adnexum displays no mass and no fullness. No erythema, tenderness or bleeding around the vagina. Vaginal discharge found.  Neurological: She is alert.  Skin: Skin is warm and dry.  Psychiatric: She has a normal mood and affect.    ED Course  Procedures (including critical care time)  Labs Reviewed  CBC WITH DIFFERENTIAL - Abnormal; Notable for the following:    Eosinophils Relative 10 (*)     All other components within  normal limits  WET PREP, GENITAL - Abnormal; Notable for the following:    WBC, Wet Prep HPF POC TOO NUMEROUS TO COUNT (*)     All other components within normal limits  BASIC METABOLIC PANEL  GC/CHLAMYDIA PROBE AMP  POCT PREGNANCY, URINE  URINALYSIS, ROUTINE W REFLEX MICROSCOPIC   US Transvaginal Non-ob  08/21/2012  *RADIOLOGY REPORT*  Clinical Data: Pelvic pain.  Cervical discharge.  Right lower quadrant abdominal pain.  Prior appendectomy and history of ovarian cysts.  TRANSABDOMINAL AND TRANSVAGINAL ULTRASOUND OF PELVIS Technique:  Both transabdominal and transvaginal ultrasound examinations of the pelvis were performed. Transabdominal technique was performed for global imaging of the pelvis including uterus, ovaries, adnexal regions, and pelvic cul-de-sac.  It was necessary to proceed with endovaginal exam following the transabdominal exam to visualize the adnexa.  Comparison:  05/10/2012; 03/06/2012  Findings:  Uterus: Measures 8.6 x 4.7 x 5.27 cm, with normal appearing myometrium.  Endometrium: Measures 9 mm in thickness, trilaminar appearance.  Right ovary:  Measures 5.9 x 3.5 x 4.9 cm and contains several internal complex cystic lesions including a 3.2 x 3.5 x 3.0 cm lesion with internal heterogeneity characteristic of a hemorrhagic cyst, without obvious nodularity or internal blood flow.  A second cystic lesion measures up to 2.1 cm with faint internal echoes is likely also a hemorrhagic cyst.  Left ovary: Measures 3.4 x 1.6 by 2.0 cm and appears normal.  Other findings: Small amount of free pelvic fluid in the cul-de- sac.  Cervix appears relatively unremarkable.  IMPRESSION:  1.3.5 cm right ovarian complex cystic lesion with characteristic appearance for a hemorrhagic cyst.  Based on current consensus guidelines this does not require follow-up.  There is also a smaller 2.1 cm right ovarian hemorrhagic cyst.  Color flow is visible in the right ovary on Doppler assessment although specific  waveform evaluation was not performed. 2.  Small amount of free pelvic fluid. 3.  Endometrial thickness within normal limits for age, with trilaminar appearance.   Original Report Authenticated By: Gaylyn Rong, M.D.    US Pelvis Complete  08/21/2012  *RADIOLOGY REPORT*  Clinical Data: Pelvic pain.  Cervical discharge.  Right lower quadrant abdominal pain.  Prior appendectomy and history of ovarian cysts.  TRANSABDOMINAL AND TRANSVAGINAL ULTRASOUND OF PELVIS Technique:  Both transabdominal and transvaginal ultrasound examinations of the pelvis were performed. Transabdominal technique was performed for global imaging of the pelvis including uterus, ovaries, adnexal regions, and pelvic cul-de-sac.  It was necessary to proceed with endovaginal exam following the transabdominal exam to visualize the adnexa.  Comparison:  05/10/2012; 03/06/2012  Findings:  Uterus: Measures 8.6 x 4.7 x 5.27 cm, with normal appearing myometrium.  Endometrium: Measures 9 mm in thickness, trilaminar appearance.  Right ovary:  Measures 5.9 x 3.5 x 4.9 cm and contains several internal complex cystic lesions including a 3.2 x 3.5 x 3.0 cm lesion with internal heterogeneity characteristic of a hemorrhagic cyst, without obvious nodularity or internal blood flow.  A second cystic lesion measures up to 2.1  cm with faint internal echoes is likely also a hemorrhagic cyst.  Left ovary: Measures 3.4 x 1.6 by 2.0 cm and appears normal.  Other findings: Small amount of free pelvic fluid in the cul-de- sac.  Cervix appears relatively unremarkable.  IMPRESSION:  1.3.5 cm right ovarian complex cystic lesion with characteristic appearance for a hemorrhagic cyst.  Based on current consensus guidelines this does not require follow-up.  There is also a smaller 2.1 cm right ovarian hemorrhagic cyst.  Color flow is visible in the right ovary on Doppler assessment although specific waveform evaluation was not performed. 2.  Small amount of free pelvic  fluid. 3.  Endometrial thickness within normal limits for age, with trilaminar appearance.   Original Report Authenticated By: Gaylyn Rong, M.D.      1. Ovarian cyst   2. PID (pelvic inflammatory disease)     1:07 PM Patient seen and examined. Work-up initiated. Medications ordered.   Vital signs reviewed and are as follows: Filed Vitals:   08/21/12 1240  BP: 129/58  Pulse: 87  Temp: 98 F (36.7 C)  Resp: 18   2:50 PM Pelvic exam performed with nurse chaperone. Patient is in a monogamous relationship and does not think she has any STD exposures. Pain medication redosed. History of LEEP.   Handoff to [employer] who will follow-up on ultrasound and dispo appropriately. Will treat for PID.    MDM  RLQ pain in patient with h/o appendectomy. Similar to previous cysts, and history suggests this is the case. Pending [place] to eval for TOA given purulent d/c noted on wet prep and exam. Will treat for PID given clinical findings regardless -- although per history patient is low risk.         Renne Crigler, [place] 08/22/12 1816

## 2012-08-22 LAB — GC/CHLAMYDIA PROBE AMP: CT Probe RNA: NEGATIVE

## 2012-08-23 ENCOUNTER — Encounter: Payer: Self-pay | Admitting: Obstetrics & Gynecology

## 2012-08-25 NOTE — ED Provider Notes (Signed)
Medical screening examination/treatment/procedure(s) were performed by non-physician practitioner and as supervising physician I was immediately available for consultation/collaboration.   Carleene Cooper III, MD 08/25/12 [phone]

## 2012-08-31 ENCOUNTER — Encounter: Payer: Self-pay | Admitting: Obstetrics & Gynecology

## 2012-08-31 ENCOUNTER — Encounter (HOSPITAL_COMMUNITY): Payer: Self-pay | Admitting: *Deleted

## 2012-08-31 ENCOUNTER — Emergency Department (HOSPITAL_COMMUNITY)
Admission: EM | Admit: 2012-08-31 | Discharge: 2012-08-31 | Disposition: A | Discharge status: Home / Self Care | Payer: Self-pay | Attending: Emergency Medicine | Admitting: Emergency Medicine

## 2012-08-31 ENCOUNTER — Ambulatory Visit (INDEPENDENT_AMBULATORY_CARE_PROVIDER_SITE_OTHER): Payer: Self-pay | Admitting: Obstetrics & Gynecology

## 2012-08-31 VITALS — BP 119/68 | HR 72 | Temp 98.0°F | Ht 66.0 in | Wt 131.8 lb

## 2012-08-31 DIAGNOSIS — N83209 Unspecified ovarian cyst, unspecified side: Secondary | ICD-10-CM | POA: Insufficient documentation

## 2012-08-31 DIAGNOSIS — N949 Unspecified condition associated with female genital organs and menstrual cycle: Secondary | ICD-10-CM | POA: Insufficient documentation

## 2012-08-31 DIAGNOSIS — R110 Nausea: Secondary | ICD-10-CM | POA: Insufficient documentation

## 2012-08-31 DIAGNOSIS — Z8619 Personal history of other infectious and parasitic diseases: Secondary | ICD-10-CM | POA: Insufficient documentation

## 2012-08-31 DIAGNOSIS — Z9089 Acquired absence of other organs: Secondary | ICD-10-CM | POA: Insufficient documentation

## 2012-08-31 DIAGNOSIS — Z0000 Encounter for general adult medical examination without abnormal findings: Secondary | ICD-10-CM

## 2012-08-31 DIAGNOSIS — Z01419 Encounter for gynecological examination (general) (routine) without abnormal findings: Secondary | ICD-10-CM

## 2012-08-31 DIAGNOSIS — F17200 Nicotine dependence, unspecified, uncomplicated: Secondary | ICD-10-CM | POA: Insufficient documentation

## 2012-08-31 MED ORDER — IBUPROFEN 800 MG PO TABS: 800.0000 mg | ORAL_TABLET | Freq: Three times a day (TID) | ORAL | Status: DC | PRN

## 2012-08-31 MED ORDER — HYDROCODONE-ACETAMINOPHEN 5-325 MG PO TABS: 2.0000 | ORAL_TABLET | Freq: Four times a day (QID) | ORAL | Status: DC | PRN

## 2012-08-31 MED ORDER — NORELGESTROMIN-ETH ESTRADIOL 150-35 MCG/24HR TD PTWK: 1.0000 | MEDICATED_PATCH | TRANSDERMAL | Status: DC

## 2012-08-31 NOTE — Progress Notes (Signed)
Subjective:    Jade Hancock is a 27 y.o. female who presents for an annual exam. She is interested in restarting OrthoEvra.  She complains of RLQ pain for the last several weeks. She was diagnosed with a right ovarian cyst in [facility] last week. She says that she goes to the ED about once a month for this same problem. The patient is sexually active and has a lot of pain with sex "feels like a knife" for the last 2 years. She denies a h/o CT or GC. GYN screening history: last pap: was abnormal: 2009. She had a LEEP at [facility] (Dr. Edwena Blow). She was then seen at the [facility]. She says that she was supposed to get follow up pap smears but she lost her MCD and didn't follow up. The patient wears seatbelts: yes. The patient participates in regular exercise: no. Has the patient ever been transfused or tattooed?: no. The patient reports that there is not domestic violence in her life.   Menstrual History: OB History    Grav Para Term Preterm Abortions TAB SAB Ect Mult Living                  Menarche age: 31 Patient's last menstrual period was 08/02/2012.    The following portions of the patient's history were reviewed and updated as appropriate: allergies, current medications, past family history, past medical history, past social history, past surgical history and problem list.  Review of Systems A comprehensive review of systems was negative. Monogamous for 10 years, lives with FOB and 4 kids. 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 Declines flu vaccine. Did not have Gardasil. Uses condoms.   Objective:    BP 119/68  Pulse 72  Temp 98 F (36.7 C) (Oral)  Ht 5\' 6"  (1.676 m)  Wt 131 lb 12.8 oz (59.784 kg)  BMI 21.27 kg/m2  LMP 08/02/2012  General Appearance:    Alert, cooperative, no distress, appears stated age  Head:    Normocephalic, without obvious abnormality, atraumatic  Eyes:    PERRL, conjunctiva/corneas clear, EOM's  intact, fundi    benign, both eyes  Ears:    Normal TM's and external ear canals, both ears  Nose:   Nares normal, septum midline, mucosa normal, no drainage    or sinus tenderness  Throat:   Lips, mucosa, and tongue normal; teeth and gums normal  Neck:   Supple, symmetrical, trachea midline, no adenopathy;    thyroid:  no enlargement/tenderness/nodules; no carotid   bruit or JVD  Back:     Symmetric, no curvature, ROM normal, no CVA tenderness  Lungs:     Clear to auscultation bilaterally, respirations unlabored  Chest Wall:    No tenderness or deformity   Heart:    Regular rate and rhythm, S1 and S2 normal, no murmur, rub   or gallop  Breast Exam:    No tenderness, masses, or nipple abnormality  Abdomen:     Soft, non-tender, bowel sounds active all four quadrants,    no masses, no organomegaly  Genitalia:    Normal female without lesion, discharge or tenderness     Extremities:   Extremities normal, atraumatic, no cyanosis or edema  Pulses:   2+ and symmetric all extremities  Skin:   Skin color, texture, turgor normal, no rashes or lesions  Lymph nodes:   Cervical, supraclavicular, and axillary nodes normal  Neurologic:   CNII-XII intact, normal  strength, sensation and reflexes    throughout  .    Assessment:    Healthy female exam.  Ovarian cyst and pain   Plan:     Breast self exam technique reviewed and patient encouraged to perform self-exam monthly. Chlamydia specimen. GC specimen. Thin prep Pap smear.  IBU 800 mg e prescribed

## 2012-08-31 NOTE — ED Provider Notes (Signed)
History     CSN: [id number]  Arrival date & time 08/31/12  1757   First MD Initiated Contact with Patient 08/31/12 2013      No chief complaint on file.   (Consider location/radiation/quality/duration/timing/severity/associated sxs/prior treatment) HPI Comments: Patient with a history of right ovarian cyst presents today with a chief complaint of RLQ abdominal pain.  Pain has been present for the past 4 months, but is gradually worsening.  She describes the pain as a sharp pain.  She was seen in the ED for this 08/21/12.  She had a pelvic ultrasound at that time, which showed two right ovarian cysts.  She reports that she was seen by her OB/GYN Dr. Marice Potter for this earlier today.  Dr. Marice Potter did a pelvic exam and a pap smear at that time.  Dr. Marice Potter prescribed the patient 800 mg Ibuprofen for the pain.  Patient reports that she has tried taking the 800 mg Ibuprofen, but does not feel that it is helping.  She also reports that her OB/GYN prescribed her Ortho Evra contraceptive.  She has a follow up appointment with Dr. Marice Potter scheduled in two weeks.  She reports that her pain today is no different than the pain that she has had in the past.  She denies nausea or vomiting.  No fever or chills.  No dysuria, increased urinary frequency, or urgency.  No vaginal discharge.  PMH significant for Appendectomy.    The history is provided by the patient.    Past Medical History  Diagnosis Date  . Strep throat   . Ovarian cyst     Past Surgical History  Procedure Date  . Appendectomy     No family history on file.  History  Substance Use Topics  . Smoking status: Current Every Day Smoker -- 0.5 packs/day    Types: Cigarettes  . Smokeless tobacco: Not on file  . Alcohol Use: No    OB History    Grav Para Term Preterm Abortions TAB SAB Ect Mult Living                  Review of Systems  Constitutional: Negative for fever and chills.  Gastrointestinal: Positive for nausea and abdominal pain.  Negative for vomiting, diarrhea and constipation.  Genitourinary: Positive for pelvic pain. Negative for dysuria, urgency, frequency, hematuria, vaginal bleeding and vaginal discharge.  All other systems reviewed and are negative.    Allergies  Review of patient's allergies indicates no known allergies.  Home Medications   Current Outpatient Rx  Name  Route  Sig  Dispense  Refill  . IBUPROFEN 800 MG PO TABS   Oral   Take 1,600 mg by mouth every 8 (eight) hours as needed.         Marland Kitchen DOXYCYCLINE HYCLATE 100 MG PO CAPS   Oral   Take 1 capsule (100 mg total) by mouth 2 (two) times daily.   28 capsule   0   . OXYCODONE-ACETAMINOPHEN 5-325 MG PO TABS   Oral   Take 2 tablets by mouth every 4 (four) hours as needed. For pain           BP 106/64  Pulse 75  Temp 98.2 F (36.8 C) (Oral)  Resp 16  SpO2 99%  LMP 08/02/2012  Physical Exam  Nursing note and vitals reviewed. Constitutional: She appears well-developed and well-nourished. No distress.  HENT:  Head: Normocephalic and atraumatic.  Mouth/Throat: Oropharynx is clear and moist.  Neck: Normal range of  motion. Neck supple.  Cardiovascular: Normal rate, regular rhythm and normal heart sounds.   Pulmonary/Chest: Effort normal and breath sounds normal.  Abdominal: Soft. Normal appearance and bowel sounds are normal. There is tenderness in the right lower quadrant. There is no rigidity, no rebound, no guarding, no tenderness at McBurney's point and negative Murphy's sign.  Musculoskeletal: Normal range of motion.  Neurological: She is alert.  Skin: Skin is warm and dry. She is not diaphoretic.  Psychiatric: She has a normal mood and affect.    ED Course  Procedures (including critical care time)  Labs Reviewed - No data to display No results found.   No diagnosis found.    MDM  Patient presenting with a chief complaint of Right sided pelvic pain that has been present for the past 4 months.  Patient had an  Ultrasound, which showed two ovarian cysts done on 08/21/12.  Pain is no different today.  PMH significant for Appendectomy.  Do not feel that further imaging is indicated at this time.  Patient afebrile.  VSS.  Patient discharged home with short course of pain medications and instructed to follow up with Dr. Marice Potter.  Return precautions discussed with the patient.          Pascal Lux Wise, PA-C 09/01/12 [phone]

## 2012-08-31 NOTE — ED Notes (Signed)
Pt complains of right lower abdominal pain and was seen here last Tuesday and diagnosed with ovarian cyst.  Pt has been continuous.  No vaginal bleeding or discharge.  Pt states that she was placed on doxycycline for elevated white count.  The ob/gyn was not able to give her pain medication and was told if she needs something stronger than ibuprofen to come here.

## 2012-09-01 ENCOUNTER — Encounter: Payer: Self-pay | Admitting: Obstetrics & Gynecology

## 2012-09-03 NOTE — ED Provider Notes (Signed)
Medical screening examination/treatment/procedure(s) were performed by non-physician practitioner and as supervising physician I was immediately available for consultation/collaboration.   Flint Melter, MD 09/03/12 [phone]

## 2012-09-05 ENCOUNTER — Encounter: Payer: Self-pay | Admitting: Obstetrics & Gynecology

## 2012-09-06 ENCOUNTER — Encounter: Payer: Self-pay | Admitting: Obstetrics & Gynecology

## 2012-09-07 ENCOUNTER — Telehealth: Payer: Self-pay | Admitting: *Deleted

## 2012-09-07 ENCOUNTER — Encounter: Payer: Self-pay | Admitting: Obstetrics & Gynecology

## 2012-09-07 MED ORDER — HYDROCODONE-ACETAMINOPHEN 5-325 MG PO TABS: ORAL_TABLET | ORAL | Status: DC

## 2012-09-07 NOTE — Telephone Encounter (Addendum)
Called pt to inform her that I have spoke w.Dr. Marice Potter and she has authorized refill of hydrocodone. This will be the last refill of narcotic that she can have. I heard a message stating that the person called is unavailable right now and no ability to leave a message.  A message was sent to pt via MyChart as she has communicated via this portal within the last 24 hrs. On 1/24, an additional message was sent to pt regarding Rx for birth control pill via MyChart

## 2012-09-09 ENCOUNTER — Other Ambulatory Visit: Payer: Self-pay | Admitting: *Deleted

## 2012-09-09 MED ORDER — NORGESTIMATE-ETH ESTRADIOL 0.25-35 MG-MCG PO TABS: 1.0000 | ORAL_TABLET | Freq: Every day | ORAL | Status: DC

## 2012-10-01 ENCOUNTER — Other Ambulatory Visit: Payer: Self-pay

## 2012-10-05 ENCOUNTER — Other Ambulatory Visit: Payer: Self-pay | Admitting: Obstetrics & Gynecology

## 2012-11-15 ENCOUNTER — Emergency Department (HOSPITAL_COMMUNITY)
Admission: EM | Admit: 2012-11-15 | Discharge: 2012-11-15 | Disposition: A | Discharge status: Home / Self Care | Payer: Self-pay | Attending: Emergency Medicine | Admitting: Emergency Medicine

## 2012-11-15 ENCOUNTER — Encounter (HOSPITAL_COMMUNITY): Payer: Self-pay | Admitting: Emergency Medicine

## 2012-11-15 DIAGNOSIS — Z8742 Personal history of other diseases of the female genital tract: Secondary | ICD-10-CM | POA: Insufficient documentation

## 2012-11-15 DIAGNOSIS — M545 Low back pain, unspecified: Secondary | ICD-10-CM | POA: Insufficient documentation

## 2012-11-15 DIAGNOSIS — Z8619 Personal history of other infectious and parasitic diseases: Secondary | ICD-10-CM | POA: Insufficient documentation

## 2012-11-15 DIAGNOSIS — M549 Dorsalgia, unspecified: Secondary | ICD-10-CM

## 2012-11-15 DIAGNOSIS — Z87891 Personal history of nicotine dependence: Secondary | ICD-10-CM | POA: Insufficient documentation

## 2012-11-15 MED ORDER — CYCLOBENZAPRINE HCL 10 MG PO TABS: 5.0000 mg | ORAL_TABLET | Freq: Once | ORAL | Status: DC

## 2012-11-15 MED ORDER — CYCLOBENZAPRINE HCL 10 MG PO TABS: 5.0000 mg | ORAL_TABLET | Freq: Two times a day (BID) | ORAL | Status: DC | PRN

## 2012-11-15 NOTE — ED Notes (Signed)
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

## 2012-11-15 NOTE — ED Provider Notes (Signed)
History    This chart was scribed for non-physician practitioner Junius Finner, PA-C working with Glynn Octave, MD by Gerlean Ren, ED Scribe. This patient was seen in room TR04C/TR04C and the patient's care was started at 7:07 PM.    CSN: [id number]  Arrival date & time 11/15/12  1743   First MD Initiated Contact with Patient 11/15/12 1904      Chief Complaint  Patient presents with  . Back Pain     The history is provided by the patient. No language interpreter was used.  Jade Hancock is a 27 y.o. female who presents to the Emergency Department complaining of right side lower back pain with sudden onset while twisting at work last night.  Pt heard "pop" at the time and pain began immediately.  Pain radiates into right leg.  Pain worsened by ambulation and most movements and has no improving factors.  Pt denies incontinence of bowel or bladder, saddle paraesthesias.  Pt reports mild tingling in legs while ambulating.     Past Medical History  Diagnosis Date  . Strep throat   . Ovarian cyst     Past Surgical History  Procedure Laterality Date  . Appendectomy      History reviewed. No pertinent family history.  History  Substance Use Topics  . Smoking status: Former Smoker -- 0.50 packs/day    Types: Cigarettes  . Smokeless tobacco: Not on file  . Alcohol Use: No    No OB history provided.   Review of Systems  Musculoskeletal: Positive for back pain.  Neurological: Negative for weakness and numbness.    Allergies  Review of patient's allergies indicates no known allergies.  Home Medications   Current Outpatient Rx  Name  Route  Sig  Dispense  Refill  . ibuprofen (ADVIL,MOTRIN) 800 MG tablet   Oral   Take 1,600 mg by mouth every 8 (eight) hours as needed for pain.          . cyclobenzaprine (FLEXERIL) 10 MG tablet   Oral   Take 0.5 tablets (5 mg total) by mouth 2 (two) times daily as needed for muscle spasms.   14 tablet   0     BP 118/59  Pulse  84  Temp(Src) 98.6 F (37 C) (Oral)  Resp 18  SpO2 97%  Physical Exam  Nursing note and vitals reviewed. Constitutional: She is oriented to person, place, and time. She appears well-developed and well-nourished. No distress.  HENT:  Head: Normocephalic and atraumatic.  Eyes: EOM are normal.  Neck: Neck supple. No tracheal deviation present.  Cardiovascular: Normal rate, regular rhythm and normal heart sounds.   Pulmonary/Chest: Effort normal and breath sounds normal. No respiratory distress. She has no wheezes.  Musculoskeletal: Normal range of motion.  Tenderness to palpation mid right lumbar region, mild pain with rotation at the waist to the left   Neurological: She is alert and oriented to person, place, and time.  Straight leg raise negative  Skin: Skin is warm and dry.  Psychiatric: She has a normal mood and affect. Her behavior is normal.    ED Course  Procedures (including critical care time) DIAGNOSTIC STUDIES: Oxygen Saturation is 97% on room air, adequate by my interpretation.    COORDINATION OF CARE: 7:11 PM- Informed pt that pain will be treated with anti-inflammatories and muscle relaxers.  Pt verbalizes understanding and agrees with plan.     1. 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  Pt felt a pop in her back.   Denies fever, PMH of cancer, no IVDU, no saddle paraesthesia or loss of bowel or bladder.   Believe pt has muscle spasm causing lower back pain.  Will tx with ibuprofen and flexeril.  Advised pt not to drive or work with patients while taking flexeril.  Will have pt f/u with PCP or urgent care as needed for pain.  Provided pt information about back injury prevention and exercises.   Vitals: unremarkable. Discharged in stable condition.    Discussed pt with attending during ED encounter.   I personally performed the services described in this documentation, which  was scribed in my presence. The recorded information has been reviewed and is accurate.    Junius Finner, PA-C 11/16/12 1311

## 2012-11-15 NOTE — ED Notes (Signed)
Pt discharged.Vital signs stable and GCS 15 

## 2012-11-16 NOTE — ED Provider Notes (Signed)
Medical screening examination/treatment/procedure(s) were performed by non-physician practitioner and as supervising physician I was immediately available for consultation/collaboration.    Glynn Octave, MD 11/16/12 [phone]

## 2012-11-18 ENCOUNTER — Emergency Department (HOSPITAL_COMMUNITY): Payer: Self-pay

## 2012-11-18 ENCOUNTER — Emergency Department (HOSPITAL_COMMUNITY)
Admission: EM | Admit: 2012-11-18 | Discharge: 2012-11-18 | Disposition: A | Discharge status: Home / Self Care | Payer: Self-pay | Attending: Emergency Medicine | Admitting: Emergency Medicine

## 2012-11-18 ENCOUNTER — Encounter (HOSPITAL_COMMUNITY): Payer: Self-pay | Admitting: Emergency Medicine

## 2012-11-18 DIAGNOSIS — R110 Nausea: Secondary | ICD-10-CM | POA: Insufficient documentation

## 2012-11-18 DIAGNOSIS — Z3201 Encounter for pregnancy test, result positive: Secondary | ICD-10-CM | POA: Insufficient documentation

## 2012-11-18 DIAGNOSIS — R109 Unspecified abdominal pain: Secondary | ICD-10-CM

## 2012-11-18 DIAGNOSIS — R5381 Other malaise: Secondary | ICD-10-CM | POA: Insufficient documentation

## 2012-11-18 DIAGNOSIS — Z8742 Personal history of other diseases of the female genital tract: Secondary | ICD-10-CM | POA: Insufficient documentation

## 2012-11-18 DIAGNOSIS — R1032 Left lower quadrant pain: Secondary | ICD-10-CM | POA: Insufficient documentation

## 2012-11-18 DIAGNOSIS — Z87891 Personal history of nicotine dependence: Secondary | ICD-10-CM | POA: Insufficient documentation

## 2012-11-18 DIAGNOSIS — O9989 Other specified diseases and conditions complicating pregnancy, childbirth and the puerperium: Secondary | ICD-10-CM | POA: Insufficient documentation

## 2012-11-18 DIAGNOSIS — Z3490 Encounter for supervision of normal pregnancy, unspecified, unspecified trimester: Secondary | ICD-10-CM

## 2012-11-18 DIAGNOSIS — Z8619 Personal history of other infectious and parasitic diseases: Secondary | ICD-10-CM | POA: Insufficient documentation

## 2012-11-18 HISTORY — DX: Unspecified tubal pregnancy without intrauterine pregnancy: O00.109

## 2012-11-18 LAB — POCT PREGNANCY, URINE: Preg Test, Ur: POSITIVE — AB

## 2012-11-18 LAB — CBC WITH DIFFERENTIAL/PLATELET
Basophils Absolute: 0 10*3/uL (ref 0.0–0.1)
Basophils Relative: 1 % (ref 0–1)
MCHC: 35 g/dL (ref 30.0–36.0)
Monocytes Absolute: 0.3 10*3/uL (ref 0.1–1.0)
Neutro Abs: 2.9 10*3/uL (ref 1.7–7.7)
Neutrophils Relative %: 49 % (ref 43–77)
Platelets: 236 10*3/uL (ref 150–400)
RDW: 13.4 % (ref 11.5–15.5)

## 2012-11-18 LAB — BASIC METABOLIC PANEL
Chloride: 107 mEq/L (ref 96–112)
Creatinine, Ser: 0.56 mg/dL (ref 0.50–1.10)
GFR calc Af Amer: >90 mL/min (ref >90)
Potassium: 3.6 mEq/L (ref 3.5–5.1)

## 2012-11-18 LAB — URINALYSIS, ROUTINE W REFLEX MICROSCOPIC
Nitrite: NEGATIVE
Specific Gravity, Urine: 1.009 (ref 1.005–1.030)
Urobilinogen, UA: 0.2 mg/dL (ref 0.0–1.0)

## 2012-11-18 LAB — URINE MICROSCOPIC-ADD ON

## 2012-11-18 LAB — ABO/RH: ABO/RH(D): O POS

## 2012-11-18 LAB — TYPE AND SCREEN: ABO/RH(D): O POS

## 2012-11-18 LAB — HCG, QUANTITATIVE, PREGNANCY: hCG, Beta Chain, Quant, S: 35294 m[IU]/mL — ABNORMAL HIGH (ref <5)

## 2012-11-18 MED ORDER — SODIUM CHLORIDE 0.9 % IV BOLUS (SEPSIS)
1000.0000 mL | Freq: Once | INTRAVENOUS | Status: AC
  Administered 2012-11-18: 1000 mL via INTRAVENOUS

## 2012-11-18 MED ORDER — ONDANSETRON HCL 4 MG/2ML IJ SOLN
4.0000 mg | Freq: Once | INTRAMUSCULAR | Status: AC
  Administered 2012-11-18: 4 mg via INTRAVENOUS
  Filled 2012-11-18: qty 2

## 2012-11-18 MED ORDER — ONDANSETRON HCL 4 MG PO TABS: 4.0000 mg | ORAL_TABLET | Freq: Four times a day (QID) | ORAL | Status: DC

## 2012-11-18 MED ORDER — HYDROMORPHONE HCL PF 1 MG/ML IJ SOLN
1.0000 mg | Freq: Once | INTRAMUSCULAR | Status: AC
  Administered 2012-11-18: 1 mg via INTRAVENOUS
  Filled 2012-11-18: qty 1

## 2012-11-18 MED ORDER — PRENATAL COMPLETE 14-0.4 MG PO TABS: 14.0000 mg | ORAL_TABLET | Freq: Every day | ORAL | Status: DC

## 2012-11-18 NOTE — ED Provider Notes (Signed)
History     CSN: [id number]  Arrival date & time 11/18/12  0927   First MD Initiated Contact with Patient 11/18/12 0940      Chief Complaint  Patient presents with  . Abdominal Cramping    (Consider location/radiation/quality/duration/timing/severity/associated sxs/prior treatment) HPI Comments: 27 year old female presents to the emergency department complaining of left-sided abdominal pain x2 days. Patient was seen in the emergency department 2 days ago on Wednesday to evaluate low back pain, she was discharged home and later that night began to develop left-sided lower abdominal pain. She describes the pain as sharp and cramping, radiating across her lower abdomen rated 10 out of 10. She took a home pregnancy test 2 days ago since she noticed she is late for her menstrual period and was positive. Last menstrual period was towards the middle of February. She has a history of a tubal pregnancy and no longer has her right fallopian tube. Admits to severe nausea without vomiting. She is feeling very fatigued. Denies vaginal bleeding, discharge or urinary symptoms. No fever or chills.  Patient is a 27 y.o. female presenting with cramps. The history is provided by the patient.  Abdominal Cramping Associated symptoms include abdominal pain, fatigue and nausea. Pertinent negatives include no vomiting.    Past Medical History  Diagnosis Date  . Strep throat   . Ovarian cyst   . Tubal pregnancy 2005    Past Surgical History  Procedure Laterality Date  . Appendectomy    . Tubal ligation Right 2005    No family history on file.  History  Substance Use Topics  . Smoking status: Former Smoker -- 0.50 packs/day    Types: Cigarettes  . Smokeless tobacco: Not on file  . Alcohol Use: No    OB History   Grav Para Term Preterm Abortions TAB SAB Ect Mult Living   1               Review of Systems  Constitutional: Positive for fatigue.  Gastrointestinal: Positive for nausea and  abdominal pain. Negative for vomiting.  Genitourinary: Negative for dysuria, flank pain, vaginal bleeding, vaginal discharge, vaginal pain and pelvic pain.  All other systems reviewed and are negative.    Allergies  Review of patient's allergies indicates no known allergies.  Home Medications   Current Outpatient Rx  Name  Route  Sig  Dispense  Refill  . cyclobenzaprine (FLEXERIL) 10 MG tablet   Oral   Take 0.5 tablets (5 mg total) by mouth 2 (two) times daily as needed for muscle spasms.   14 tablet   0   . ibuprofen (ADVIL,MOTRIN) 800 MG tablet   Oral   Take 1,600 mg by mouth every 8 (eight) hours as needed for pain.            BP 109/59  Pulse 64  Temp(Src) 98.1 F (36.7 C) (Oral)  Resp 21  SpO2 100%  LMP 10/03/2012  Physical Exam  Nursing note and vitals reviewed. Constitutional: She is oriented to person, place, and time. She appears well-developed and well-nourished. No distress.  Anxious  HENT:  Head: Normocephalic and atraumatic.  Mouth/Throat: Oropharynx is clear and moist.  Eyes: Conjunctivae and EOM are normal.  Neck: Normal range of motion. Neck supple.  Cardiovascular: Normal rate, regular rhythm and normal heart sounds.   Pulmonary/Chest: Effort normal and breath sounds normal. No respiratory distress.  Abdominal: Soft. Normal appearance and bowel sounds are normal. She exhibits no distension and no mass. There is  tenderness. There is rebound and guarding. There is no rigidity.    No peritoneal signs. Rebound tenderness with palpation on the right side causing severe pain on the left.  Musculoskeletal: Normal range of motion. She exhibits no edema.  Neurological: She is alert and oriented to person, place, and time.  Skin: Skin is warm and dry. She is not diaphoretic.  Psychiatric: Her behavior is normal. Her mood appears anxious.    ED Course  Procedures (including critical care time)  Labs Reviewed  URINALYSIS, ROUTINE W REFLEX MICROSCOPIC -  Abnormal; Notable for the following:    Leukocytes, UA SMALL (*)    All other components within normal limits  HCG, QUANTITATIVE, PREGNANCY - Abnormal; Notable for the following:    hCG, Beta Chain, Quant, S 35294 (*)    All other components within normal limits  CBC WITH DIFFERENTIAL - Abnormal; Notable for the following:    RBC 3.68 (*)    Hemoglobin 11.5 (*)    HCT 32.9 (*)    Eosinophils Relative 7 (*)    All other components within normal limits  BASIC METABOLIC PANEL - Abnormal; Notable for the following:    Calcium 8.2 (*)    All other components within normal limits  POCT PREGNANCY, URINE - Abnormal; Notable for the following:    Preg Test, Ur POSITIVE (*)    All other components within normal limits  URINE MICROSCOPIC-ADD ON  TYPE AND SCREEN  ABO/RH   US Ob Comp Less 14 Wks  11/18/2012  *RADIOLOGY REPORT*  Clinical Data: Pelvic pain cramping.  OBSTETRIC <14 WK [place] AND TRANSVAGINAL OB US  Technique:  Both transabdominal and transvaginal ultrasound examinations were performed for complete evaluation of the gestation as well as the maternal uterus, adnexal regions, and pelvic cul-de-sac.  Transvaginal technique was performed to assess early pregnancy.  Comparison:  08/21/2012  Intrauterine gestational sac:  Single/normal shape Yolk sac: Visualized Embryo: Visualized Cardiac Activity: Visualized Heart Rate: 131 bpm  CRL: 5  mm  6 w  6 d          [place] EDC: 07/12/2013  Maternal uterus/adnexae: A small subchorionic hemorrhage is noted.  No fibroids identified. Both ovaries are normal in appearance.  Previously seen hemorrhagic right ovarian cyst has resolved.  No adnexal mass identified. Trace free fluid seen in cul-de-sac.  IMPRESSION:  1.  Single living IUP measuring 6 weeks 6 days with [place] EDC of 07/12/2013. 2.  Small subchorionic hemorrhage noted.   Original Report Authenticated By: Myles Rosenthal, M.D.    US Ob Transvaginal  11/18/2012  *RADIOLOGY REPORT*  Clinical Data: Pelvic pain cramping.   OBSTETRIC <14 WK [place] AND TRANSVAGINAL OB US  Technique:  Both transabdominal and transvaginal ultrasound examinations were performed for complete evaluation of the gestation as well as the maternal uterus, adnexal regions, and pelvic cul-de-sac.  Transvaginal technique was performed to assess early pregnancy.  Comparison:  08/21/2012  Intrauterine gestational sac:  Single/normal shape Yolk sac: Visualized Embryo: Visualized Cardiac Activity: Visualized Heart Rate: 131 bpm  CRL: 5  mm  6 w  6 d          [place] EDC: 07/12/2013  Maternal uterus/adnexae: A small subchorionic hemorrhage is noted.  No fibroids identified. Both ovaries are normal in appearance.  Previously seen hemorrhagic right ovarian cyst has resolved.  No adnexal mass identified. Trace free fluid seen in cul-de-sac.  IMPRESSION:  1.  Single living IUP measuring 6 weeks 6 days with [place] EDC of  07/12/2013. 2.  Small subchorionic hemorrhage noted.   Original Report Authenticated By: Myles Rosenthal, M.D.      1. Abdominal pain   2. Pregnancy       MDM  27 year old female with left-sided abdominal pain. Urine pregnancy positive. Initial concern for ectopic pregnancy. Ultrasound showing a single living intrauterine pregnancy measuring 6 weeks and 6 days. Pain controlled after receiving Dilaudid in the emergency department. Left side of abdomen still mildly tender, however marked clinical improvement noted. No nausea or vomiting since receiving Zofran. After telling patient she has an intrauterine pregnancy, she becomes tearful and states she cannot handle the stress of another child. Prenatal vitamins given at discharge. States she does not have an OB/GYN at this time. [facility] I advised her to take an iron supplementation due to mild anemia. Return precautions discussed. Patient states understanding of plan and is agreeable. I discussed this case with Dr. Rhunette Croft who agrees with plan of  care.        Trevor Mace, PA-C 11/18/12 [phone]

## 2012-11-18 NOTE — ED Notes (Signed)
Pt c/o left sided abd cramping that started last night while she was at work. Pt sts she is pregnant but unsure how far along, LMP sometime in feb. Pt sts she has hx of tubal pregnancies. Pt has 4 children, been pregnant 7X. Pt in nad, ,ambulatory to room, skin warm and dry, resp e/u. Pt denies taking prenatal vitamins and sts she hasn't had any prenatal care yet. Pt sts she feels weaker than normal. Pt c/o extreme nausea. Pt sts she also has sharp pains across lower abd that started last night too.

## 2012-11-18 NOTE — ED Notes (Signed)
Pt c/o urinary frequency but denies burning with urination.

## 2012-11-26 ENCOUNTER — Encounter (HOSPITAL_COMMUNITY): Payer: Self-pay | Admitting: *Deleted

## 2012-11-26 ENCOUNTER — Inpatient Hospital Stay (HOSPITAL_COMMUNITY)
Admission: AD | Admit: 2012-11-26 | Discharge: 2012-11-26 | Disposition: A | Discharge status: Home / Self Care | Payer: Self-pay | Source: Ambulatory Visit | Attending: Obstetrics & Gynecology | Admitting: Obstetrics & Gynecology

## 2012-11-26 DIAGNOSIS — O4690 Antepartum hemorrhage, unspecified, unspecified trimester: Secondary | ICD-10-CM

## 2012-11-26 DIAGNOSIS — O209 Hemorrhage in early pregnancy, unspecified: Secondary | ICD-10-CM | POA: Insufficient documentation

## 2012-11-26 DIAGNOSIS — R109 Unspecified abdominal pain: Secondary | ICD-10-CM | POA: Insufficient documentation

## 2012-11-26 HISTORY — DX: Umbilical hernia without obstruction or gangrene: K42.9

## 2012-11-26 LAB — URINALYSIS, ROUTINE W REFLEX MICROSCOPIC
Hgb urine dipstick: NEGATIVE
Leukocytes, UA: NEGATIVE
Nitrite: NEGATIVE
Specific Gravity, Urine: 1.02 (ref 1.005–1.030)
Urobilinogen, UA: 0.2 mg/dL (ref 0.0–1.0)

## 2012-11-26 LAB — WET PREP, GENITAL
Trich, Wet Prep: NONE SEEN
Yeast Wet Prep HPF POC: NONE SEEN

## 2012-11-26 MED ORDER — OXYCODONE-ACETAMINOPHEN 5-325 MG PO TABS: 1.0000 | ORAL_TABLET | Freq: Four times a day (QID) | ORAL | Status: DC | PRN

## 2012-11-26 NOTE — MAU Note (Signed)
Pt reports she woke up this morning went to BR and wiped and saw some blood. A small clot came out and still is trickling a little blood. Reports having abd cramping as well

## 2012-11-26 NOTE — ED Provider Notes (Signed)
Medical screening examination/treatment/procedure(s) were performed by non-physician practitioner and as supervising physician I was immediately available for consultation/collaboration.  Garrick Midgley, MD 11/26/12 0002 

## 2012-11-26 NOTE — MAU Provider Note (Signed)
History     CSN: [id number]  Arrival date and time: 11/26/12 1230   None     Chief Complaint  Patient presents with  . Vaginal Bleeding   HPI 27 y.o. [id number] at [redacted]w[redacted]d by Ernst Spell with bleeding and cramping. Had blood on TP this morning and started cramping. 6-7/10 pain, like menstrual cramps, radiating to low back. No fever/chills/dysuria. No nausea/vomiting. Tried tylenol with no improvement.   Ultrasound on 4/4 showed small sub-chorionic hemorrhage.  Seen in ED 4/4. Has not established care yet.   OB History   Grav Para Term Preterm Abortions TAB SAB Ect Mult Living   7 4   2  1 1  4       Past Medical History  Diagnosis Date  . Strep throat   . Ovarian cyst   . Tubal pregnancy 2005  . Umbilical hernia     Past Surgical History  Procedure Laterality Date  . Appendectomy    . Tubal ligation Right 2005    History reviewed. No pertinent family history.  History  Substance Use Topics  . Smoking status: Former Smoker -- 0.50 packs/day    Types: Cigarettes  . Smokeless tobacco: Not on file  . Alcohol Use: No    Allergies: No Known Allergies  Prescriptions prior to admission  Medication Sig Dispense Refill  . ibuprofen (ADVIL,MOTRIN) 200 MG tablet Take 800 mg by mouth every 6 (six) hours as needed for pain.      Marland Kitchen ondansetron (ZOFRAN) 4 MG tablet Take 1 tablet (4 mg total) by mouth every 6 (six) hours.  12 tablet  0  . Prenatal Vit-Fe Fumarate-FA (PRENATAL COMPLETE) 14-0.4 MG TABS Take 14 mg by mouth daily.  60 each  0    ROS  Negative except as stated in HPI.   Physical Exam   Blood pressure 116/67, pulse 75, temperature 98.5 F (36.9 C), temperature source Oral, resp. rate 18, height 5\' 6"  (1.676 m), weight 74.571 kg (164 lb 6.4 oz), last menstrual period 10/03/2012.  Physical Exam  Constitutional: She is oriented to person, place, and time. She appears well-developed and well-nourished. Distressed: mild.  HENT:  Head: Atraumatic.  Eyes: Conjunctivae  and EOM are normal.  Neck: Normal range of motion. Neck supple.  Cardiovascular: Normal rate, regular rhythm and normal heart sounds.   Respiratory: Effort normal and breath sounds normal. No respiratory distress.  GI: Bowel sounds are normal. There is tenderness (suprapubic). There is no rebound and no guarding.  Genitourinary:  Normal external genitalia. Normal vagina. No blood in vault or at os. Does have ectropion. Clear/white mucous at os. Mild discomfort with exam and mild CMT. No adnexal tenderness/masses.  Musculoskeletal: Normal range of motion. She exhibits no edema and no tenderness.  Neurological: She is alert and oriented to person, place, and time.  Skin: Skin is warm and dry.  Psychiatric: She has a normal mood and affect.   Results for orders placed during the hospital encounter of 11/26/12 (from the past 24 hour(s))  URINALYSIS, ROUTINE W REFLEX MICROSCOPIC     Status: None   Collection Time    11/26/12 12:54 PM      Result Value Range   Color, Urine YELLOW  YELLOW   APPearance CLEAR  CLEAR   Specific Gravity, Urine 1.020  1.005 - 1.030   pH 5.5  5.0 - 8.0   Glucose, UA NEGATIVE  NEGATIVE mg/dL   Hgb urine dipstick NEGATIVE  NEGATIVE   Bilirubin Urine NEGATIVE  NEGATIVE   Ketones, ur NEGATIVE  NEGATIVE mg/dL   Protein, ur NEGATIVE  NEGATIVE mg/dL   Urobilinogen, UA 0.2  0.0 - 1.0 mg/dL   Nitrite NEGATIVE  NEGATIVE   Leukocytes, UA NEGATIVE  NEGATIVE  WET PREP, GENITAL     Status: Abnormal   Collection Time    11/26/12  1:10 PM      Result Value Range   Yeast Wet Prep HPF POC NONE SEEN  NONE SEEN   Trich, Wet Prep NONE SEEN  NONE SEEN   Clue Cells Wet Prep HPF POC NONE SEEN  NONE SEEN   WBC, Wet Prep HPF POC MODERATE (*) NONE SEEN   Fetal heart activity visualized on bedside ultrasound.   MAU Course  Procedures   Assessment and Plan  27 y.o. [id number] at [redacted]w[redacted]d with vaginal bleeding and cramping first trimester.   - Fetal heart activity present - Confirmed  IUP - Blood type O positive - Likely due to subchorionic hemorrhage but could also be threatened miscarriage. Discussed precautions. Hemodynamically stable. D/C home.   Napoleon Form 11/26/2012, 1:17 PM

## 2012-11-26 NOTE — MAU Note (Signed)
Patient presents to MAU with c/o vaginal bleeding at 1100; reports passing small clot. Reports she works 3rd shift and was not feeling well enough to work last night. Denies antecedent sexual intercourse or trauma.  Reports lower abdominal pain and back pressure since 1100 today.

## 2012-11-28 NOTE — MAU Provider Note (Signed)
Attestation of Attending Supervision of Advanced Practitioner (CNM/NP): Evaluation and management procedures were performed by the Advanced Practitioner under my supervision and collaboration. I have reviewed the Advanced Practitioner's note and chart, and I agree with the management and plan.  Chantae Soo H. 5:11 PM   

## 2012-11-29 ENCOUNTER — Inpatient Hospital Stay (HOSPITAL_COMMUNITY): Payer: Self-pay

## 2012-11-29 ENCOUNTER — Inpatient Hospital Stay (HOSPITAL_COMMUNITY)
Admission: AD | Admit: 2012-11-29 | Discharge: 2012-11-29 | Disposition: A | Discharge status: Home / Self Care | Payer: Self-pay | Source: Ambulatory Visit | Attending: Obstetrics & Gynecology | Admitting: Obstetrics & Gynecology

## 2012-11-29 ENCOUNTER — Encounter (HOSPITAL_COMMUNITY): Payer: Self-pay | Admitting: *Deleted

## 2012-11-29 DIAGNOSIS — O210 Mild hyperemesis gravidarum: Secondary | ICD-10-CM | POA: Insufficient documentation

## 2012-11-29 DIAGNOSIS — O26899 Other specified pregnancy related conditions, unspecified trimester: Secondary | ICD-10-CM

## 2012-11-29 DIAGNOSIS — R1031 Right lower quadrant pain: Secondary | ICD-10-CM | POA: Insufficient documentation

## 2012-11-29 DIAGNOSIS — O209 Hemorrhage in early pregnancy, unspecified: Secondary | ICD-10-CM | POA: Insufficient documentation

## 2012-11-29 MED ORDER — ONDANSETRON 8 MG PO TBDP
8.0000 mg | ORAL_TABLET | Freq: Once | ORAL | Status: AC
  Administered 2012-11-29: 8 mg via ORAL
  Filled 2012-11-29: qty 1

## 2012-11-29 NOTE — MAU Note (Signed)
Patient states she has been having abdominal pain since 0400 this am. Hurts worse with palpation and walking. States nausea, no vomiting. Denies bleeding but has a reddish discharge.

## 2012-11-29 NOTE — MAU Provider Note (Signed)
History     CSN: [id number]  Arrival date and time: 11/29/12 1554   None     Chief Complaint  Patient presents with  . Abdominal Pain  . Nausea   HPI  Pt is [id number] @ [redacted]w[redacted]d pregnant presenting with RLQ abdominal pain righ radiating to middle;  Pt has "knot" in the middle of her abdomen that is tender when palpated.  She denies constipation or diarrhea- she last had a bowel movement today which was normal.  Pt denies UTI symptoms.  The pain recurs every 5 minutes and lasts about 1 to 2 minutes. Pt has been nauseated but denies vomiting.  Pt last ate about 12 noon.  Pt noticed blood tinged mucous vaginal discharge and had some abd pain associated with the discharge.Pt states her pain is worse with palpation and with walking.     Past Medical History  Diagnosis Date  . Strep throat   . Ovarian cyst   . Tubal pregnancy 2005  . Umbilical hernia     Past Surgical History  Procedure Laterality Date  . Appendectomy    . Tubal ligation Right 2005    Family History  Problem Relation Age of Onset  . Alcohol abuse Neg Hx   . Arthritis Neg Hx   . Birth defects Neg Hx   . Asthma Neg Hx   . Cancer Neg Hx   . COPD Neg Hx   . Diabetes Neg Hx   . Depression Neg Hx   . Drug abuse Neg Hx   . Early death Neg Hx   . Hearing loss Neg Hx   . Heart disease Neg Hx   . Hyperlipidemia Neg Hx   . Hypertension Neg Hx   . Kidney disease Neg Hx   . Learning disabilities Neg Hx   . Mental illness Neg Hx   . Mental retardation Neg Hx   . Miscarriages / Stillbirths Neg Hx   . Stroke Neg Hx   . Vision loss Neg Hx     History  Substance Use Topics  . Smoking status: Former Smoker -- 0.50 packs/day    Types: Cigarettes  . Smokeless tobacco: Not on file  . Alcohol Use: No    Allergies: No Known Allergies  Prescriptions prior to admission  Medication Sig Dispense Refill  . acetaminophen (TYLENOL) 500 MG tablet Take 1,000 mg by mouth every 4 (four) hours as needed for pain (For  headache.).      Marland Kitchen oxyCODONE-acetaminophen (PERCOCET/ROXICET) 5-325 MG per tablet Take 1 tablet by mouth every 6 (six) hours as needed for pain.  10 tablet  0  . Prenatal Vit-Fe Fumarate-FA (PRENATAL MULTIVITAMIN) TABS Take 1 tablet by mouth daily at 12 noon.        Review of Systems  Constitutional: Negative for fever and chills.  Gastrointestinal: Positive for nausea and abdominal pain. Negative for vomiting, diarrhea and constipation.  Genitourinary: Negative for dysuria and urgency.   Physical Exam   Blood pressure 102/60, pulse 59, temperature 98 F (36.7 C), temperature source Oral, resp. rate 16, height 5\' 8"  (1.727 m), weight 60.51 kg (133 lb 6.4 oz), last menstrual period 10/03/2012, SpO2 100.00%.  Physical Exam  Nursing note and vitals reviewed. Constitutional: She is oriented to person, place, and time. She appears well-developed and well-nourished. No distress.  HENT:  Head: Normocephalic.  Eyes: Pupils are equal, round, and reactive to light.  Neck: Normal range of motion. Neck supple.  Cardiovascular: Normal rate.   Respiratory: Effort  normal.  GI: Soft. She exhibits no distension. There is tenderness. There is no rebound and no guarding.  RLQ tenderness with palpation- no rebound  Genitourinary:  Mod amount of mucous type clear-yellow discharge invaul;t cervix clean, mildy tender; cervix closed; uterus 8 week size nonder; right adnexal without palpable enlargement but tender with palpation- no rebound  Musculoskeletal: Normal range of motion.  Neurological: She is alert and oriented to person, place, and time.  Skin: Skin is warm and dry.  Psychiatric: She has a normal mood and affect.    MAU Course  Procedures  FROM 11/18/2012  RADIOLOGY REPORT*  Clinical Data: Pelvic pain cramping.  OBSTETRIC <14 WK [place] AND TRANSVAGINAL OB US  Technique: Both transabdominal and transvaginal ultrasound  examinations were performed for complete evaluation of the  gestation as  well as the maternal uterus, adnexal regions, and  pelvic cul-de-sac. Transvaginal technique was performed to assess  early pregnancy.  Comparison: 08/21/2012  Intrauterine gestational sac: Single/normal shape  Yolk sac: Visualized  Embryo: Visualized  Cardiac Activity: Visualized  Heart Rate: 131 bpm  CRL: 5 mm 6 w 6 d [place] EDC: 07/12/2013  Maternal uterus/adnexae:  A small subchorionic hemorrhage is noted. No fibroids identified.  Both ovaries are normal in appearance. Previously seen hemorrhagic  right ovarian cyst has resolved. No adnexal mass identified.  Trace free fluid seen in cul-de-sac.  IMPRESSION:  1. Single living IUP measuring 6 weeks 6 days with [place] EDC of  07/12/2013.  2. Small subchorionic hemorrhage noted.  Original Report Authenticated By: Myles Rosenthal, M.D.  Zofran 8mg  ODT given for nausea Pt wanting ultrasound for reassurance  Today's ultrasound:  Clinical Data: Subchorionic hemorrhage.  TRANSVAGINAL OBSTETRIC US  Technique: Transvaginal ultrasound was performed for complete  evaluation of the gestation as well as the maternal uterus, adnexal  regions, and pelvic cul-de-sac.  Comparison: 11/18/2012  Intrauterine gestational sac: Single  Yolk sac: Yes  Embryo: Yes  Cardiac Activity: Yes  Heart Rate: 162  CRL: 1.79 cm 8 w 2 d [place] EDC: 07/10/2013  Subchorionic hemorrhage: The subchorionic hemorrhage is more  extensive than on the prior study with four measurable areas around  the gestational sac.  The right ovary is normal. Corpus luteum cyst on the left ovary.  IMPRESSION:  The subchorionic hemorrhage is more extensive than on the prior  exam. Normal fetal growth in the interval.  Original Report Authenticated By: Francene Boyers, M.D.  Discussed with Dr. Marice Potter- expectant management Discussed with pt ultrasound results  Assessment and Plan  [facility]  Jade Hancock 11/29/2012, 5:52 PM

## 2012-12-01 ENCOUNTER — Encounter (HOSPITAL_COMMUNITY): Payer: Self-pay | Admitting: *Deleted

## 2012-12-01 ENCOUNTER — Inpatient Hospital Stay (HOSPITAL_COMMUNITY)
Admission: AD | Admit: 2012-12-01 | Discharge: 2012-12-01 | Disposition: A | Discharge status: Home / Self Care | Payer: Self-pay | Source: Ambulatory Visit | Attending: Obstetrics & Gynecology | Admitting: Obstetrics & Gynecology

## 2012-12-01 ENCOUNTER — Inpatient Hospital Stay (HOSPITAL_COMMUNITY): Payer: Self-pay

## 2012-12-01 DIAGNOSIS — O200 Threatened abortion: Secondary | ICD-10-CM | POA: Insufficient documentation

## 2012-12-01 DIAGNOSIS — O209 Hemorrhage in early pregnancy, unspecified: Secondary | ICD-10-CM

## 2012-12-01 DIAGNOSIS — O4690 Antepartum hemorrhage, unspecified, unspecified trimester: Secondary | ICD-10-CM

## 2012-12-01 HISTORY — DX: Unspecified abnormal cytological findings in specimens from cervix uteri: R87.619

## 2012-12-01 HISTORY — DX: Reserved for concepts with insufficient information to code with codable children: IMO0002

## 2012-12-01 MED ORDER — ONDANSETRON 8 MG PO TBDP
8.0000 mg | ORAL_TABLET | Freq: Once | ORAL | Status: AC
  Administered 2012-12-01: 8 mg via ORAL
  Filled 2012-12-01: qty 1

## 2012-12-01 MED ORDER — ACETAMINOPHEN-CODEINE #3 300-30 MG PO TABS
1.0000 | ORAL_TABLET | Freq: Once | ORAL | Status: AC
  Administered 2012-12-01: 1 via ORAL
  Filled 2012-12-01: qty 1

## 2012-12-01 NOTE — MAU Provider Note (Signed)
History     CSN: [id number]  Arrival date and time: 12/01/12 1478   First Provider Initiated Contact with Patient 12/01/12 0901      Chief Complaint  Patient presents with  . Threatened Miscarriage   HPI  Pt is a [id number] here at 8.3 wks IUP with report of vaginal bleeding that increased around 0200 this morning.   Pt has been previously seen in the MAU and diagnosed with a viable IUP with subchorionic hemorrhage on 11/29/12.  Reports increased cramping and nausea this am.    Past Medical History  Diagnosis Date  . Strep throat   . Ovarian cyst   . Tubal pregnancy 2005  . Umbilical hernia   . Abnormal Pap smear     LEEP    Past Surgical History  Procedure Laterality Date  . Appendectomy    . Laparoscopy for ectopic pregnancy      left tube removed    Family History  Problem Relation Age of Onset  . Alcohol abuse Neg Hx   . Arthritis Neg Hx   . Birth defects Neg Hx   . Cancer Neg Hx   . Depression Neg Hx   . Drug abuse Neg Hx   . Early death Neg Hx   . Hearing loss Neg Hx   . Heart disease Neg Hx   . Hyperlipidemia Neg Hx   . Kidney disease Neg Hx   . Learning disabilities Neg Hx   . Mental illness Neg Hx   . Mental retardation Neg Hx   . Miscarriages / Stillbirths Neg Hx   . Stroke Neg Hx   . Vision loss Neg Hx   . Hypertension Mother   . Diabetes Father   . Hypertension Father   . Asthma Father   . Asthma Paternal Aunt   . COPD Paternal Aunt   . Diabetes Paternal Aunt   . Hypertension Paternal Aunt   . Diabetes Paternal Grandmother   . Asthma Paternal Grandmother     History  Substance Use Topics  . Smoking status: Former Smoker -- 0.50 packs/day    Types: Cigarettes  . Smokeless tobacco: Never Used     Comment: quit with preg  . Alcohol Use: No    Allergies: No Known Allergies  Prescriptions prior to admission  Medication Sig Dispense Refill  . acetaminophen (TYLENOL) 500 MG tablet Take 1,000 mg by mouth every 4 (four) hours as needed for  pain.       . Prenatal Vit-Fe Fumarate-FA (PRENATAL MULTIVITAMIN) TABS Take 1 tablet by mouth daily.         Review of Systems  Gastrointestinal: Positive for nausea and abdominal pain (cramping). Negative for vomiting.  Genitourinary:       Bleeding  All other systems reviewed and are negative.   Physical Exam   Blood pressure 111/76, pulse 63, temperature 98 F (36.7 C), temperature source Oral, resp. rate 18, height 5' 6.5" (1.689 m), weight 61.689 kg (136 lb), last menstrual period 10/03/2012.  Physical Exam  Constitutional: She is oriented to person, place, and time. She appears well-developed and well-nourished. No distress.  HENT:  Head: Normocephalic.  Neck: Normal range of motion. Neck supple.  Cardiovascular: Normal rate, regular rhythm and normal heart sounds.   Respiratory: Effort normal and breath sounds normal. No respiratory distress.  GI: Soft. She exhibits no mass. There is no tenderness. There is no rebound and no guarding.  Genitourinary: There is bleeding (scant) around the vagina.  Neurological:  She is alert and oriented to person, place, and time.  Skin: Skin is warm and dry.    MAU Course  Procedures  No results found for this or any previous visit (from the past 24 hour(s)). Tylenol #3 > minimal relief   Assessment and Plan  Early Pregnancy Bleeding  Plan: Provided reassurance based on ultrasound. Bleeding precautions given.  [facility] 12/01/2012, 9:02 AM

## 2012-12-01 NOTE — MAU Provider Note (Signed)
Attestation of Attending Supervision of Advanced Practitioner (CNM/NP): Evaluation and management procedures were performed by the Advanced Practitioner under my supervision and collaboration.  I have reviewed the Advanced Practitioner's note and chart, and I agree with the management and plan.  HARRAWAY-SMITH, Onia Shiflett 2:58 PM     

## 2012-12-01 NOTE — MAU Provider Note (Signed)
Attestation of Attending Supervision of Advanced Practitioner (PA/CNM/NP): Evaluation and management procedures were performed by the Advanced Practitioner under my supervision and collaboration.  I have reviewed the Advanced Practitioner's note and chart, and I agree with the management and plan.  Elery Cadenhead, MD, FACOG Attending Obstetrician & Gynecologist Faculty Practice, [facility]  

## 2012-12-01 NOTE — MAU Note (Signed)
Dx with subchorionic hemorrhage.  Started having more bleeding last night.  Pain started around midnight.

## 2012-12-05 ENCOUNTER — Encounter (HOSPITAL_COMMUNITY): Payer: Self-pay | Admitting: *Deleted

## 2012-12-05 ENCOUNTER — Emergency Department (HOSPITAL_COMMUNITY)
Admission: EM | Admit: 2012-12-05 | Discharge: 2012-12-05 | Disposition: A | Discharge status: Home / Self Care | Payer: Self-pay | Attending: Emergency Medicine | Admitting: Emergency Medicine

## 2012-12-05 ENCOUNTER — Encounter: Payer: Self-pay | Admitting: Emergency Medicine

## 2012-12-05 DIAGNOSIS — Z8742 Personal history of other diseases of the female genital tract: Secondary | ICD-10-CM | POA: Insufficient documentation

## 2012-12-05 DIAGNOSIS — O26899 Other specified pregnancy related conditions, unspecified trimester: Secondary | ICD-10-CM

## 2012-12-05 DIAGNOSIS — Z8719 Personal history of other diseases of the digestive system: Secondary | ICD-10-CM | POA: Insufficient documentation

## 2012-12-05 DIAGNOSIS — O9989 Other specified diseases and conditions complicating pregnancy, childbirth and the puerperium: Secondary | ICD-10-CM | POA: Insufficient documentation

## 2012-12-05 DIAGNOSIS — R109 Unspecified abdominal pain: Secondary | ICD-10-CM | POA: Insufficient documentation

## 2012-12-05 DIAGNOSIS — Z87891 Personal history of nicotine dependence: Secondary | ICD-10-CM | POA: Insufficient documentation

## 2012-12-05 DIAGNOSIS — N939 Abnormal uterine and vaginal bleeding, unspecified: Secondary | ICD-10-CM

## 2012-12-05 DIAGNOSIS — O200 Threatened abortion: Secondary | ICD-10-CM | POA: Insufficient documentation

## 2012-12-05 DIAGNOSIS — R110 Nausea: Secondary | ICD-10-CM

## 2012-12-05 LAB — WET PREP, GENITAL
Clue Cells Wet Prep HPF POC: NONE SEEN
Trich, Wet Prep: NONE SEEN

## 2012-12-05 LAB — CBC
HCT: 36.8 % (ref 36.0–46.0)
Hemoglobin: 13.1 g/dL (ref 12.0–15.0)
MCH: 31.6 pg (ref 26.0–34.0)
MCHC: 35.6 g/dL (ref 30.0–36.0)
MCV: 88.9 fL (ref 78.0–100.0)

## 2012-12-05 MED ORDER — SODIUM CHLORIDE 0.9 % IV BOLUS (SEPSIS)
1000.0000 mL | Freq: Once | INTRAVENOUS | Status: AC
  Administered 2012-12-05: 1000 mL via INTRAVENOUS

## 2012-12-05 MED ORDER — ONDANSETRON HCL 4 MG/2ML IJ SOLN
4.0000 mg | Freq: Once | INTRAMUSCULAR | Status: AC
  Administered 2012-12-05: 4 mg via INTRAVENOUS
  Filled 2012-12-05: qty 2

## 2012-12-05 MED ORDER — HYDROCODONE-ACETAMINOPHEN 5-325 MG PO TABS: 1.0000 | ORAL_TABLET | ORAL | Status: DC | PRN

## 2012-12-05 MED ORDER — MORPHINE SULFATE 4 MG/ML IJ SOLN
4.0000 mg | Freq: Once | INTRAMUSCULAR | Status: AC
  Administered 2012-12-05: 4 mg via INTRAVENOUS
  Filled 2012-12-05: qty 1

## 2012-12-05 MED ORDER — HYDROCODONE-ACETAMINOPHEN 5-325 MG PO TABS: 1.0000 | ORAL_TABLET | Freq: Four times a day (QID) | ORAL | Status: DC | PRN

## 2012-12-05 NOTE — ED Notes (Signed)
Pt is 8.[redacted] weeks pregnant and states that she has had multiple visits between here and [facility] for vaginal bleeding.  Pt states that today she gets abdominal pain that is severe and when she goes to bathroom a big gush of bright red blood came.  Pt states she had a placental tear early in pregnancy.  Pt is having pain under left side of rib and complaining of sob.  Pt complaining of fatigue

## 2012-12-05 NOTE — ED Provider Notes (Signed)
History     CSN: [id number]  Arrival date & time 12/05/12  1434   First MD Initiated Contact with Patient 12/05/12 1825      Chief Complaint  Patient presents with  . Vaginal Bleeding    (Consider location/radiation/quality/duration/timing/severity/associated sxs/prior treatment) HPI Comments: 22 y F G7P4024 with confirmed IUP (U/S on 4/4 at 6 wks and 6 days) here for abdominal cramping and vaginal bleeding.  She has had 2 visits to this ED and [facility], but has not seen her OB yet.  She has also had multiple U/S's for this complaint which according to the pt have shown enlaring subchorionic hemorrhage.  She reports that the bleeding this AM was BRB, which is different from the dark brown bleeding she had been experiencing.  She only soaked one panty-liner this AM.  No further use of pads.  +nausea, no vomiting.  No fevers, chills, urinary symptoms, syncope, lightheadedness or other complaints.   Patient is a 27 y.o. female presenting with vaginal bleeding. The history is provided by the patient.  Vaginal Bleeding This is a recurrent problem. The current episode started 1 to 4 weeks ago. The problem occurs intermittently. The problem has been gradually worsening (now bright red). Associated symptoms include abdominal pain (cramping, intermittent, lower abdomen) and nausea. Pertinent negatives include no change in bowel habit, chest pain, coughing, fever or vomiting. She has tried acetaminophen for the symptoms. The treatment provided mild relief.    Past Medical History  Diagnosis Date  . Strep throat   . Ovarian cyst   . Tubal pregnancy 2005  . Umbilical hernia   . Abnormal Pap smear     LEEP    Past Surgical History  Procedure Laterality Date  . Appendectomy    . Laparoscopy for ectopic pregnancy      left tube removed    Family History  Problem Relation Age of Onset  . Alcohol abuse Neg Hx   . Arthritis Neg Hx   . Birth defects Neg Hx   . Cancer  Neg Hx   . Depression Neg Hx   . Drug abuse Neg Hx   . Early death Neg Hx   . Hearing loss Neg Hx   . Heart disease Neg Hx   . Hyperlipidemia Neg Hx   . Kidney disease Neg Hx   . Learning disabilities Neg Hx   . Mental illness Neg Hx   . Mental retardation Neg Hx   . Miscarriages / Stillbirths Neg Hx   . Stroke Neg Hx   . Vision loss Neg Hx   . Hypertension Mother   . Diabetes Father   . Hypertension Father   . Asthma Father   . Asthma Paternal Aunt   . COPD Paternal Aunt   . Diabetes Paternal Aunt   . Hypertension Paternal Aunt   . Diabetes Paternal Grandmother   . Asthma Paternal Grandmother     History  Substance Use Topics  . Smoking status: Former Smoker -- 0.50 packs/day    Types: Cigarettes  . Smokeless tobacco: Never Used     Comment: quit with preg  . Alcohol Use: No    OB History   Grav Para Term Preterm Abortions TAB SAB Ect Mult Living   7 4 4  2  1 1  4       Review of Systems  Constitutional: Negative for fever.  Respiratory: Negative for cough.   Cardiovascular: Negative for chest pain.  Gastrointestinal:  Positive for nausea and abdominal pain (cramping, intermittent, lower abdomen). Negative for vomiting and change in bowel habit.  Genitourinary: Positive for vaginal bleeding. Negative for dysuria and flank pain.  All other systems reviewed and are negative.    Allergies  Review of patient's allergies indicates no known allergies.  Home Medications   Current Outpatient Rx  Name  Route  Sig  Dispense  Refill  . acetaminophen (TYLENOL) 500 MG tablet   Oral   Take 1,000 mg by mouth every 4 (four) hours as needed for pain.          . Prenatal Vit-Fe Fumarate-FA (PRENATAL MULTIVITAMIN) TABS   Oral   Take 1 tablet by mouth daily.          Marland Kitchen HYDROcodone-acetaminophen (NORCO/VICODIN) 5-325 MG per tablet   Oral   Take 1 tablet by mouth every 4 (four) hours as needed for pain.   10 tablet   0     BP 104/59  Pulse 70  Temp(Src) 97.8  F (36.6 C) (Oral)  Resp 16  SpO2 99%  LMP 10/03/2012  Physical Exam  Vitals reviewed. Constitutional: She is oriented to person, place, and time. She appears well-developed and well-nourished.  HENT:  Right Ear: External ear normal.  Left Ear: External ear normal.  Mouth/Throat: No oropharyngeal exudate.  Eyes: Conjunctivae and EOM are normal. Pupils are equal, round, and reactive to light.  Neck: Normal range of motion. Neck supple.  Cardiovascular: Normal rate, regular rhythm, normal heart sounds and intact distal pulses.  Exam reveals no gallop and no friction rub.   No murmur heard. Pulmonary/Chest: Effort normal and breath sounds normal.  Abdominal: Soft. Bowel sounds are normal. She exhibits no distension. There is tenderness (mild, suprapubic).  Genitourinary: There is no rash, tenderness or lesion on the right labia. There is no rash, tenderness or lesion on the left labia. Cervix exhibits discharge (brown and white). Cervix exhibits no friability. Right adnexum displays no mass and no tenderness. Left adnexum displays no mass and no tenderness. No erythema or tenderness around the vagina. No signs of injury around the vagina. Vaginal discharge (scant, white and brown) found.  Mild diffuse vague TTP during bimanual, did not localize  Musculoskeletal: Normal range of motion. She exhibits no edema.  Neurological: She is alert and oriented to person, place, and time. No cranial nerve deficit.  Skin: Skin is warm and dry. No rash noted.  Psychiatric: She has a normal mood and affect.    ED Course  Procedures (including critical care time)  Labs Reviewed  WET PREP, GENITAL - Abnormal; Notable for the following:    WBC, Wet Prep HPF POC TOO NUMEROUS TO COUNT (*)    All other components within normal limits  GC/CHLAMYDIA PROBE AMP  CBC   No results found.   1. Abdominal pain complicating pregnancy   2. Nausea   3. Vaginal bleeding   4. Threatened abortion       MDM    11 y F G7P4024 with confirmed IUP (U/S on 4/4 at 6 wks and 6 days) here for abdominal cramping and vaginal bleeding.  She has had 2 visits to this ED and [facility], but has not seen her OB yet.  She has also had multiple U/S's for this complaint which according to the pt have shown enlaring subchorionic hemorrhage.  She reports that the bleeding this AM was BRB, which is different from the dark brown bleeding she had  been experiencing.  She only soaked one panty-liner this AM.  No further use of pads.  +nausea, no vomiting.  No fevers, chills, urinary symptoms, syncope, lightheadedness or other complaints.  AFVSS, mild suprapubic TTP.  No CVAT.  Pt reports her abd cramping has not really changed, but the change in the color of the blood had her concerned.  CBC from triage WNLs.  Pt is O+ so no indication for Rhogam.  Will plan for pelvic exam and bedside ultrasound to check for FHT.  1:15 AM IUP measured at 9+1 weeks.  FHR at 158.  No pelvic FF.  9:10 PM Pelvic exam with mild discharge and mild TTP on bimanual exam.  Pain did not localize and it is felt her symptoms are related to her subchorionic hemorrhage and not 2/2 a pelvic infection.  Pelvic labs sent.   Return precautions reviewed.  It is felt the pt is stable for d/c with close PCP f/u.  All questions answered and patient expressed understanding.  Best callback number for GC/Chlamydia testing is [phone] (cell).  Disposition: Discharge  Condition: Good  Follow-up Information   Follow up with your OB/Gyn. (as discussed)         Medication List    TAKE these medications       HYDROcodone-acetaminophen 5-325 MG per tablet  Commonly known as:  NORCO/VICODIN  Take 1 tablet by mouth every 4 (four) hours as needed for pain.       Pt seen in conjunction with my attending, Dr. Anitra Lauth.Oleh Genin, MD PGY-II [facility] Emergency Medicine Resident     Oleh Genin, MD 12/06/12 [phone]

## 2012-12-05 NOTE — ED Notes (Signed)
ED Resident Beverely Pace in with pt

## 2012-12-06 LAB — GC/CHLAMYDIA PROBE AMP: GC Probe RNA: NEGATIVE

## 2012-12-08 NOTE — ED Provider Notes (Signed)
I saw and evaluated the patient, reviewed the resident's note and I agree with the findings and plan. Pt with known IUP with subchorionic hemorrhage with ongoing vaginal bleeding.  Pt has had ongoing bleeding and wants to know if baby is alive. FHR 150 and pt is O+.  No further need for evaluation at this time and pt d/ced  Gwyneth Sprout, MD 12/08/12 2228

## 2012-12-11 ENCOUNTER — Encounter (HOSPITAL_COMMUNITY): Payer: Self-pay | Admitting: *Deleted

## 2012-12-11 ENCOUNTER — Inpatient Hospital Stay (HOSPITAL_COMMUNITY)
Admission: AD | Admit: 2012-12-11 | Discharge: 2012-12-11 | Disposition: A | Discharge status: Hospice | Payer: Self-pay | Source: Ambulatory Visit | Attending: Obstetrics & Gynecology | Admitting: Obstetrics & Gynecology

## 2012-12-11 DIAGNOSIS — O209 Hemorrhage in early pregnancy, unspecified: Secondary | ICD-10-CM | POA: Insufficient documentation

## 2012-12-11 DIAGNOSIS — O210 Mild hyperemesis gravidarum: Secondary | ICD-10-CM | POA: Insufficient documentation

## 2012-12-11 DIAGNOSIS — O219 Vomiting of pregnancy, unspecified: Secondary | ICD-10-CM

## 2012-12-11 LAB — URINALYSIS, ROUTINE W REFLEX MICROSCOPIC
Glucose, UA: NEGATIVE mg/dL
Leukocytes, UA: NEGATIVE
Nitrite: NEGATIVE
Specific Gravity, Urine: 1.025 (ref 1.005–1.030)
pH: 6 (ref 5.0–8.0)

## 2012-12-11 NOTE — MAU Provider Note (Signed)
History     CSN: [id number]  Arrival date & time 12/11/12  1447   None     No chief complaint on file.   (Consider location/radiation/quality/duration/timing/severity/associated sxs/prior treatment) HPI Jade Hancock is a 27 y.o. [id number]  At [redacted]w[redacted]d. She has known small subchorionic hemorrhage, started bleeding and cramping again this am. The bleeding was bright red, on tissue with wiping x 3. She had 1 <1 cm piece of tissue/blood clot. She thought it looked like a hand.  She is nauseated, vomited x 1 this am.  She has an appt this Wed in [facility] to start prenatal care.  Past Medical History  Diagnosis Date  . Strep throat   . Ovarian cyst   . Tubal pregnancy 2005  . Umbilical hernia   . Abnormal Pap smear     LEEP    Past Surgical History  Procedure Laterality Date  . Appendectomy    . Laparoscopy for ectopic pregnancy      right tube removed  . Leep      Family History  Problem Relation Age of Onset  . Alcohol abuse Neg Hx   . Arthritis Neg Hx   . Birth defects Neg Hx   . Cancer Neg Hx   . Depression Neg Hx   . Drug abuse Neg Hx   . Early death Neg Hx   . Hearing loss Neg Hx   . Heart disease Neg Hx   . Hyperlipidemia Neg Hx   . Kidney disease Neg Hx   . Learning disabilities Neg Hx   . Mental illness Neg Hx   . Mental retardation Neg Hx   . Miscarriages / Stillbirths Neg Hx   . Stroke Neg Hx   . Vision loss Neg Hx   . Hypertension Mother   . Diabetes Father   . Hypertension Father   . Asthma Father   . Asthma Paternal Aunt   . COPD Paternal Aunt   . Diabetes Paternal Aunt   . Hypertension Paternal Aunt   . Diabetes Paternal Grandmother   . Asthma Paternal Grandmother     History  Substance Use Topics  . Smoking status: Former Smoker -- 0.50 packs/day    Types: Cigarettes    Quit date: 10/13/2012  . Smokeless tobacco: Never Used     Comment: quit with preg  . Alcohol Use: No    OB History   Grav Para Term Preterm Abortions TAB SAB Ect  Mult Living   7 4 4  2  1 1  4       Review of Systems  Constitutional: Negative for fever and chills.  Gastrointestinal: Positive for nausea and vomiting.  Genitourinary: Positive for vaginal bleeding and pelvic pain.    Allergies  Review of patient's allergies indicates no known allergies.  Home Medications  No current outpatient prescriptions on file.  BP 107/54  Pulse 67  Temp(Src) 98.6 F (37 C) (Oral)  Resp 16  Ht 5\' 7"  (1.702 m)  Wt 137 lb (62.143 kg)  BMI 21.45 kg/m2  LMP 10/03/2012  Physical Exam  Constitutional: She is oriented to person, place, and time. She appears well-developed and well-nourished.  Abdominal: Soft. There is no tenderness.  + FHT's  Genitourinary:  Pelvic exam- Vulva-nl anatomy, skin intact Vagina- scant tan thick discharge-no active bleeding Cx- closed, long, firm Uterus- enlarged 8-10 wk size Adn- non tender  Musculoskeletal: Normal range of motion.  Neurological: She is alert and oriented to person, place, and time.  Skin: Skin is warm and dry.  Psychiatric: She has a normal mood and affect. Her behavior is normal.    ED Course  Procedures (including critical care time)  Labs Reviewed  URINALYSIS, ROUTINE W REFLEX MICROSCOPIC   ASSESSMENT:  Bleeding at 9 6/7 wks, known small subchorionic hemorrhage + FHT's Nausea and vomiting of pregnancy  PLAN:  Pelvic rest Management of N&V reviewed Keep her appt Wed for prenatal care       MDM

## 2012-12-11 NOTE — MAU Note (Signed)
Pt woke up this morning with bleeding and cramping. She passed a couple clots and some tissue.  Did not take anything for pain.

## 2013-01-22 ENCOUNTER — Inpatient Hospital Stay (HOSPITAL_COMMUNITY)
Admission: AD | Admit: 2013-01-22 | Discharge: 2013-01-23 | Disposition: A | Discharge status: Home / Self Care | Payer: Self-pay | Source: Ambulatory Visit | Attending: Obstetrics & Gynecology | Admitting: Obstetrics & Gynecology

## 2013-01-22 ENCOUNTER — Inpatient Hospital Stay (HOSPITAL_COMMUNITY): Payer: Self-pay

## 2013-01-22 ENCOUNTER — Encounter (HOSPITAL_COMMUNITY): Payer: Self-pay | Admitting: *Deleted

## 2013-01-22 DIAGNOSIS — O26859 Spotting complicating pregnancy, unspecified trimester: Secondary | ICD-10-CM | POA: Insufficient documentation

## 2013-01-22 DIAGNOSIS — R102 Pelvic and perineal pain: Secondary | ICD-10-CM

## 2013-01-22 DIAGNOSIS — R109 Unspecified abdominal pain: Secondary | ICD-10-CM | POA: Insufficient documentation

## 2013-01-22 DIAGNOSIS — N949 Unspecified condition associated with female genital organs and menstrual cycle: Secondary | ICD-10-CM

## 2013-01-22 DIAGNOSIS — O9989 Other specified diseases and conditions complicating pregnancy, childbirth and the puerperium: Secondary | ICD-10-CM

## 2013-01-22 LAB — URINALYSIS, ROUTINE W REFLEX MICROSCOPIC
Leukocytes, UA: NEGATIVE
Nitrite: NEGATIVE
Specific Gravity, Urine: 1.025 (ref 1.005–1.030)
Urobilinogen, UA: 1 mg/dL (ref 0.0–1.0)
pH: 5.5 (ref 5.0–8.0)

## 2013-01-22 NOTE — MAU Provider Note (Signed)
History     CSN: [id number]  Arrival date and time: 01/22/13 2043   None     Chief Complaint  Patient presents with  . Abdominal Pain   HPI  Jade Hancock is a 27 y.o. [id number] at [redacted]w[redacted]d who presents tonight with cramping and spotting for 2 days.  She points to the area of the RL and states that that is where the pain is located.   Past Medical History  Diagnosis Date  . Strep throat   . Ovarian cyst   . Tubal pregnancy 2005  . Umbilical hernia   . Abnormal Pap smear     LEEP    Past Surgical History  Procedure Laterality Date  . Appendectomy    . Laparoscopy for ectopic pregnancy      right tube removed  . Leep      Family History  Problem Relation Age of Onset  . Alcohol abuse Neg Hx   . Arthritis Neg Hx   . Birth defects Neg Hx   . Cancer Neg Hx   . Depression Neg Hx   . Drug abuse Neg Hx   . Early death Neg Hx   . Hearing loss Neg Hx   . Heart disease Neg Hx   . Hyperlipidemia Neg Hx   . Kidney disease Neg Hx   . Learning disabilities Neg Hx   . Mental illness Neg Hx   . Mental retardation Neg Hx   . Miscarriages / Stillbirths Neg Hx   . Stroke Neg Hx   . Vision loss Neg Hx   . Hypertension Mother   . Diabetes Father   . Hypertension Father   . Asthma Father   . Asthma Paternal Aunt   . COPD Paternal Aunt   . Diabetes Paternal Aunt   . Hypertension Paternal Aunt   . Diabetes Paternal Grandmother   . Asthma Paternal Grandmother     History  Substance Use Topics  . Smoking status: Former Smoker -- 0.50 packs/day    Types: Cigarettes    Quit date: 10/13/2012  . Smokeless tobacco: Never Used     Comment: quit with preg  . Alcohol Use: No    Allergies: No Known Allergies  Prescriptions prior to admission  Medication Sig Dispense Refill  . acetaminophen (TYLENOL) 500 MG tablet Take 1,000 mg by mouth every 4 (four) hours as needed for pain.       . Prenatal Vit-Fe Fumarate-FA (PRENATAL MULTIVITAMIN) TABS Take 1 tablet by mouth daily.          Review of Systems  Constitutional: Negative for fever and chills.  Eyes: Negative for blurred vision.  Respiratory: Negative for cough.   Cardiovascular: Negative for chest pain.  Gastrointestinal: Negative for nausea, vomiting, diarrhea and constipation.  Genitourinary: Negative for dysuria, urgency and frequency.  Musculoskeletal: Negative for myalgias.  Neurological: Negative for dizziness and headaches.   Physical Exam   Blood pressure 112/64, pulse 70, temperature 98.4 F (36.9 C), temperature source Oral, resp. rate 18, height 5\' 6"  (1.676 m), weight 63.504 kg (140 lb), last menstrual period 10/03/2012, SpO2 100.00%.  Physical Exam  Nursing note and vitals reviewed. Constitutional: She is oriented to person, place, and time. She appears well-developed and well-nourished. No distress.  Cardiovascular: Normal rate.   Respiratory: Effort normal.  GI: Soft. There is tenderness (tender along the round ligaments).  Neurological: She is alert and oriented to person, place, and time.  Skin: Skin is warm.  Psychiatric: She  has a normal mood and affect.    MAU Course  Procedures  Results for orders placed during the hospital encounter of 01/22/13 (from the past 24 hour(s))  URINALYSIS, ROUTINE W REFLEX MICROSCOPIC     Status: Abnormal   Collection Time    01/22/13  9:08 PM      Result Value Range   Color, Urine YELLOW  YELLOW   APPearance CLEAR  CLEAR   Specific Gravity, Urine 1.025  1.005 - 1.030   pH 5.5  5.0 - 8.0   Glucose, UA NEGATIVE  NEGATIVE mg/dL   Hgb urine dipstick NEGATIVE  NEGATIVE   Bilirubin Urine NEGATIVE  NEGATIVE   Ketones, ur 15 (*) NEGATIVE mg/dL   Protein, ur NEGATIVE  NEGATIVE mg/dL   Urobilinogen, UA 1.0  0.0 - 1.0 mg/dL   Nitrite NEGATIVE  NEGATIVE   Leukocytes, UA NEGATIVE  NEGATIVE    *RADIOLOGY REPORT*  Clinical Data: Cramping and spotting. Estimated gestational age by  LMP is 15 weeks 6 days.  LIMITED OBSTETRIC ULTRASOUND  Number of  Fetuses: 1  Heart Rate: 152 bpm  Movement: Fetal movement is observed.  Breathing: Not evaluated.  Presentation: Cephalic presentation.  Placental Location: Placenta is posterior and does not appear to be  low-lying.  Previa: No previa.  Amniotic Fluid (Subjective): Normal.  Vertical Pocket 6.4 cm  BPD: 3.4 cm 16 w 4 d EDC: 07/05/2013  MATERNAL FINDINGS:  Cervix: Close. Cervical length measures 3.9 cm.  Uterus/Adnexae: Both ovaries are visualized and are symmetrical in  appearance. No adnexal masses.  IMPRESSION:  No acute abnormality demonstrated.  Recommend followup with non-emergent complete OB 14+ wk US  examination for fetal biometric evaluation and anatomic survey if  not already performed.  Original Report Authenticated By: Burman Nieves, M.D.   Assessment and Plan   1. Pain of round ligament complicating pregnancy, antepartum    Comfort measures reviewed 2nd trimester danger signs reviewed [facility] as soon as possible  Tawnya Crook 01/22/2013, 11:09 PM

## 2013-01-22 NOTE — MAU Note (Signed)
Pt reports she is cramping really bad for about a week, states it is worse when she is working, or when she is lifting her legs up and down. States she had spotting yesterday but none today.

## 2013-01-23 NOTE — MAU Provider Note (Signed)
Attestation of Attending Supervision of Advanced Practitioner (CNM/NP): Evaluation and management procedures were performed by the Advanced Practitioner under my supervision and collaboration.  I have reviewed the Advanced Practitioner's note and chart, and I agree with the management and plan.  HARRAWAY-SMITH, Coburn Knaus 12:20 AM

## 2013-02-11 ENCOUNTER — Inpatient Hospital Stay (HOSPITAL_COMMUNITY)
Admission: AD | Admit: 2013-02-11 | Discharge: 2013-02-11 | Disposition: A | Discharge status: Home / Self Care | Payer: Self-pay | Source: Ambulatory Visit | Attending: Obstetrics and Gynecology | Admitting: Obstetrics and Gynecology

## 2013-02-11 ENCOUNTER — Encounter (HOSPITAL_COMMUNITY): Payer: Self-pay | Admitting: *Deleted

## 2013-02-11 DIAGNOSIS — O2390 Unspecified genitourinary tract infection in pregnancy, unspecified trimester: Secondary | ICD-10-CM | POA: Insufficient documentation

## 2013-02-11 DIAGNOSIS — R1032 Left lower quadrant pain: Secondary | ICD-10-CM | POA: Insufficient documentation

## 2013-02-11 DIAGNOSIS — N390 Urinary tract infection, site not specified: Secondary | ICD-10-CM | POA: Insufficient documentation

## 2013-02-11 DIAGNOSIS — O0930 Supervision of pregnancy with insufficient antenatal care, unspecified trimester: Secondary | ICD-10-CM | POA: Insufficient documentation

## 2013-02-11 DIAGNOSIS — N949 Unspecified condition associated with female genital organs and menstrual cycle: Secondary | ICD-10-CM

## 2013-02-11 LAB — URINE MICROSCOPIC-ADD ON

## 2013-02-11 LAB — URINALYSIS, ROUTINE W REFLEX MICROSCOPIC
Nitrite: NEGATIVE
Protein, ur: NEGATIVE mg/dL
Specific Gravity, Urine: >1.03 — ABNORMAL HIGH (ref 1.005–1.030)
Urobilinogen, UA: 0.2 mg/dL (ref 0.0–1.0)

## 2013-02-11 LAB — WET PREP, GENITAL
Trich, Wet Prep: NONE SEEN
Yeast Wet Prep HPF POC: NONE SEEN

## 2013-02-11 MED ORDER — NITROFURANTOIN MONOHYD MACRO 100 MG PO CAPS: 100.0000 mg | ORAL_CAPSULE | Freq: Two times a day (BID) | ORAL | Status: DC

## 2013-02-11 NOTE — MAU Note (Signed)
Pt reports having a sharp pain in left side especially when she is walking. Reports she had some spotting last night.

## 2013-02-11 NOTE — MAU Provider Note (Signed)
History     CSN: [id number]  Arrival date and time: 02/11/13 1226   First Provider Initiated Contact with Patient 02/11/13 1309      Chief Complaint  Patient presents with  . Abdominal Pain   HPI Jade Hancock is a 27 y.o. [id number] at [redacted]w[redacted]d. She presents with c/o sharp pain LLQ with walking or lifting her leg x 1 1/2 wks. She had red spotting last night. She is having sl cramping. No change in discharge, no odor or itching. No leaking of fluid. No recent intercourse. Starting to feel fetal movement. Will start prenatal care 7/23 with clinic downstairs.   OB History   Grav Para Term Preterm Abortions TAB SAB Ect Mult Living   7 4 4  2  1 1  4       Past Medical History  Diagnosis Date  . Strep throat   . Ovarian cyst   . Tubal pregnancy 2005  . Umbilical hernia   . Abnormal Pap smear     LEEP    Past Surgical History  Procedure Laterality Date  . Appendectomy    . Laparoscopy for ectopic pregnancy      right tube removed  . Leep      Family History  Problem Relation Age of Onset  . Alcohol abuse Neg Hx   . Arthritis Neg Hx   . Birth defects Neg Hx   . Cancer Neg Hx   . Depression Neg Hx   . Drug abuse Neg Hx   . Early death Neg Hx   . Hearing loss Neg Hx   . Heart disease Neg Hx   . Hyperlipidemia Neg Hx   . Kidney disease Neg Hx   . Learning disabilities Neg Hx   . Mental illness Neg Hx   . Mental retardation Neg Hx   . Miscarriages / Stillbirths Neg Hx   . Stroke Neg Hx   . Vision loss Neg Hx   . Hypertension Mother   . Diabetes Father   . Hypertension Father   . Asthma Father   . Asthma Paternal Aunt   . COPD Paternal Aunt   . Diabetes Paternal Aunt   . Hypertension Paternal Aunt   . Diabetes Paternal Grandmother   . Asthma Paternal Grandmother     History  Substance Use Topics  . Smoking status: Former Smoker -- 0.50 packs/day    Types: Cigarettes    Quit date: 10/13/2012  . Smokeless tobacco: Never Used     Comment: quit with preg  .  Alcohol Use: No    Allergies: No Known Allergies  Prescriptions prior to admission  Medication Sig Dispense Refill  . acetaminophen (TYLENOL) 500 MG tablet Take 1,000 mg by mouth every 4 (four) hours as needed for pain.       . Prenatal Vit-Fe Fumarate-FA (PRENATAL MULTIVITAMIN) TABS Take 1 tablet by mouth daily.         Review of Systems  Constitutional: Negative for fever and chills.  Gastrointestinal: Negative for nausea, vomiting, diarrhea and constipation.  Genitourinary: Negative for dysuria, urgency, frequency, hematuria and flank pain.   Physical Exam   Blood pressure 109/65, pulse 64, temperature 98.3 F (36.8 C), temperature source Oral, resp. rate 18, height 5\' 7"  (1.702 m), weight 138 lb 9.6 oz (62.869 kg), last menstrual period 10/03/2012.  Physical Exam  Constitutional: She is oriented to person, place, and time. She appears well-developed and well-nourished.  GI: Soft. Bowel sounds are normal. She exhibits  no distension. There is tenderness. There is no rebound and no guarding.  Genitourinary:  Pelvic exam: Vulva- nl anatomy,skin intacet  Vagina- small amt thick white discharge, no blood Cx parous, long,closed Uterus- gravid 18w size Adn- non tender  Musculoskeletal: Normal range of motion.  Neurological: She is alert and oriented to person, place, and time.  Skin: Skin is warm and dry.  Psychiatric: She has a normal mood and affect. Her behavior is normal.    MAU Course  Procedures  MDM Results for orders placed during the hospital encounter of 02/11/13 (from the past 24 hour(s))  URINALYSIS, ROUTINE W REFLEX MICROSCOPIC     Status: Abnormal   Collection Time    02/11/13 12:30 PM      Result Value Range   Color, Urine YELLOW  YELLOW   APPearance HAZY (*) CLEAR   Specific Gravity, Urine >1.030 (*) 1.005 - 1.030   pH 6.0  5.0 - 8.0   Glucose, UA NEGATIVE  NEGATIVE mg/dL   Hgb urine dipstick NEGATIVE  NEGATIVE   Bilirubin Urine NEGATIVE  NEGATIVE    Ketones, ur NEGATIVE  NEGATIVE mg/dL   Protein, ur NEGATIVE  NEGATIVE mg/dL   Urobilinogen, UA 0.2  0.0 - 1.0 mg/dL   Nitrite NEGATIVE  NEGATIVE   Leukocytes, UA TRACE (*) NEGATIVE  URINE MICROSCOPIC-ADD ON     Status: Abnormal   Collection Time    02/11/13 12:30 PM      Result Value Range   Squamous Epithelial / LPF FEW (*) RARE   WBC, UA 3-6  <3 WBC/hpf   RBC / HPF 0-2  <3 RBC/hpf   Bacteria, UA MANY (*) RARE   Urine-Other MUCOUS PRESENT    WET PREP, GENITAL     Status: Abnormal   Collection Time    02/11/13  1:40 PM      Result Value Range   Yeast Wet Prep HPF POC NONE SEEN  NONE SEEN   Trich, Wet Prep NONE SEEN  NONE SEEN   Clue Cells Wet Prep HPF POC FEW (*) NONE SEEN   WBC, Wet Prep HPF POC MODERATE (*) NONE SEEN     Assessment and Plan  Urinary tract infection 18 5/[redacted] wks EGA No prenatal care Pt had normal U/S 6/8- was 15 6/7 wks   Macrobid bid x 7 Urine C&S Tylenol, heating pad, stretches Keep appt 7/23 to start  prenatal care   Katrisha Segall M. 02/11/2013, 1:13 PM

## 2013-02-12 LAB — URINE CULTURE: Colony Count: 25000

## 2013-02-12 LAB — GC/CHLAMYDIA PROBE AMP
CT Probe RNA: NEGATIVE
GC Probe RNA: NEGATIVE

## 2013-02-12 NOTE — MAU Provider Note (Signed)
Attestation of Attending Supervision of Advanced Practitioner (CNM/NP): Evaluation and management procedures were performed by the Advanced Practitioner under my supervision and collaboration.  I have reviewed the Advanced Practitioner's note and chart, and I agree with the management and plan.  Suleika Donavan 02/12/2013 6:22 AM

## 2013-02-19 ENCOUNTER — Inpatient Hospital Stay (HOSPITAL_COMMUNITY)
Admission: AD | Admit: 2013-02-19 | Discharge: 2013-02-19 | Disposition: A | Discharge status: Home / Self Care | Payer: Self-pay | Source: Ambulatory Visit | Attending: Obstetrics & Gynecology | Admitting: Obstetrics & Gynecology

## 2013-02-19 ENCOUNTER — Encounter (HOSPITAL_COMMUNITY): Payer: Self-pay | Admitting: *Deleted

## 2013-02-19 DIAGNOSIS — R1032 Left lower quadrant pain: Secondary | ICD-10-CM | POA: Insufficient documentation

## 2013-02-19 DIAGNOSIS — N949 Unspecified condition associated with female genital organs and menstrual cycle: Secondary | ICD-10-CM

## 2013-02-19 DIAGNOSIS — O99891 Other specified diseases and conditions complicating pregnancy: Secondary | ICD-10-CM | POA: Insufficient documentation

## 2013-02-19 LAB — WET PREP, GENITAL
Clue Cells Wet Prep HPF POC: NONE SEEN
Trich, Wet Prep: NONE SEEN

## 2013-02-19 LAB — URINALYSIS, ROUTINE W REFLEX MICROSCOPIC
Hgb urine dipstick: NEGATIVE
Specific Gravity, Urine: 1.02 (ref 1.005–1.030)
Urobilinogen, UA: 0.2 mg/dL (ref 0.0–1.0)
pH: 6 (ref 5.0–8.0)

## 2013-02-19 LAB — URINE MICROSCOPIC-ADD ON

## 2013-02-19 MED ORDER — CYCLOBENZAPRINE HCL 10 MG PO TABS: 10.0000 mg | ORAL_TABLET | Freq: Two times a day (BID) | ORAL | Status: DC | PRN

## 2013-02-19 MED ORDER — CYCLOBENZAPRINE HCL 10 MG PO TABS
10.0000 mg | ORAL_TABLET | Freq: Once | ORAL | Status: AC
  Administered 2013-02-19: 10 mg via ORAL
  Filled 2013-02-19: qty 1

## 2013-02-19 MED ORDER — ACETAMINOPHEN 500 MG PO TABS
1000.0000 mg | ORAL_TABLET | Freq: Once | ORAL | Status: AC
  Administered 2013-02-19: 1000 mg via ORAL
  Filled 2013-02-19: qty 2

## 2013-02-19 NOTE — MAU Provider Note (Signed)
History     CSN: [id number]  Arrival date and time: 02/19/13 4098   First Provider Initiated Contact with Patient 02/19/13 2016      Chief Complaint  Patient presents with  . Abdominal Pain  . Vaginal Bleeding   HPI [name] is a 27 y.o. [id number] at [redacted]w[redacted]d who presents to MAU today with complaint of sharp LLQ pain that comes and goes. This is worse with ambulation and lifting. 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 She also noted one episode of spotting this morning. The patient has had spotting previously in this pregnancy and has had multiple [place] evaluations which showed a small subchorionic hemorrhage earlier in pregnancy. The patient was seen last on 02/11/13 with the same complaint and treated for UTI with Macrobid. She has had some nausea without vomiting, diarrhea or constipation. She denies vaginal discharge, UTI symptoms or fever.   OB History   Grav Para Term Preterm Abortions TAB SAB Ect Mult Living   7 4 4  2  1 1  4       Past Medical History  Diagnosis Date  . Strep throat   . Ovarian cyst   . Tubal pregnancy 2005  . Umbilical hernia   . Abnormal Pap smear     LEEP    Past Surgical History  Procedure Laterality Date  . Appendectomy    . Laparoscopy for ectopic pregnancy      right tube removed  . Leep      Family History  Problem Relation Age of Onset  . Alcohol abuse Neg Hx   . Arthritis Neg Hx   . Birth defects Neg Hx   . Cancer Neg Hx   . Depression Neg Hx   . Drug abuse Neg Hx   . Early death Neg Hx   . Hearing loss Neg Hx   . Heart disease Neg Hx   . Hyperlipidemia Neg Hx   . Kidney disease Neg Hx   . Learning disabilities Neg Hx   . Mental illness Neg Hx   . Mental retardation Neg Hx   . Miscarriages / Stillbirths Neg Hx   . Stroke Neg Hx   . Vision loss Neg Hx   . Hypertension Mother   . Diabetes Father   . Hypertension Father   . Asthma Father   . Asthma Paternal Aunt   . COPD  Paternal Aunt   . Diabetes Paternal Aunt   . Hypertension Paternal Aunt   . Diabetes Paternal Grandmother   . Asthma Paternal Grandmother     History  Substance Use Topics  . Smoking status: Former Smoker -- 0.50 packs/day    Types: Cigarettes    Quit date: 10/13/2012  . Smokeless tobacco: Never Used     Comment: quit with preg  . Alcohol Use: No    Allergies: No Known Allergies  Prescriptions prior to admission  Medication Sig Dispense Refill  . acetaminophen (TYLENOL) 500 MG tablet Take 1,000 mg by mouth every 4 (four) hours as needed for pain.       . nitrofurantoin, macrocrystal-monohydrate, (MACROBID) 100 MG capsule Take 1 capsule (100 mg total) by mouth 2 (two) times daily.  10 capsule  0  . Prenatal Vit-Fe Fumarate-FA (PRENATAL MULTIVITAMIN) TABS Take 1 tablet by mouth daily.         Review of Systems  Constitutional: Negative for fever and malaise/fatigue.  Gastrointestinal: Positive  for nausea and abdominal pain. Negative for vomiting, diarrhea and constipation.  Genitourinary: Negative for dysuria, urgency and frequency.       Neg - vaginal discharge + vaginal bleeding   Physical Exam   Blood pressure 116/59, pulse 66, temperature 98.4 F (36.9 C), temperature source Oral, resp. rate 16, height 5\' 7"  (1.702 m), weight 141 lb 9.6 oz (64.229 kg), last menstrual period 10/03/2012, SpO2 100.00%.  Physical Exam  Constitutional: She is oriented to person, place, and time. She appears well-developed and well-nourished. No distress.  HENT:  Head: Normocephalic and atraumatic.  Cardiovascular: Normal rate, regular rhythm and normal heart sounds.   Respiratory: Effort normal and breath sounds normal. No respiratory distress.  GI: Soft. She exhibits no distension and no mass. There is tenderness (mild tenderness to palpation of the LLQ). There is no rebound and no guarding.  Genitourinary: Uterus is enlarged (appropriate for GA). Uterus is not tender. Cervix exhibits  friability. Cervix exhibits no motion tenderness and no discharge. No bleeding around the vagina. Vaginal discharge (small amount of thick white discharge noted in the vagina) found.  Neurological: She is alert and oriented to person, place, and time.  Skin: Skin is warm and dry. No erythema.  Psychiatric: She has a normal mood and affect.   Results for orders placed during the hospital encounter of 02/19/13 (from the past 24 hour(s))  URINALYSIS, ROUTINE W REFLEX MICROSCOPIC     Status: Abnormal   Collection Time    02/19/13  7:29 PM      Result Value Range   Color, Urine YELLOW  YELLOW   APPearance HAZY (*) CLEAR   Specific Gravity, Urine 1.020  1.005 - 1.030   pH 6.0  5.0 - 8.0   Glucose, UA NEGATIVE  NEGATIVE mg/dL   Hgb urine dipstick NEGATIVE  NEGATIVE   Bilirubin Urine NEGATIVE  NEGATIVE   Ketones, ur NEGATIVE  NEGATIVE mg/dL   Protein, ur NEGATIVE  NEGATIVE mg/dL   Urobilinogen, UA 0.2  0.0 - 1.0 mg/dL   Nitrite NEGATIVE  NEGATIVE   Leukocytes, UA SMALL (*) NEGATIVE  URINE MICROSCOPIC-ADD ON     Status: Abnormal   Collection Time    02/19/13  7:29 PM      Result Value Range   Squamous Epithelial / LPF FEW (*) RARE   WBC, UA 3-6  <3 WBC/hpf   RBC / HPF 3-6  <3 RBC/hpf   Bacteria, UA MANY (*) RARE  WET PREP, GENITAL     Status: Abnormal   Collection Time    02/19/13  8:25 PM      Result Value Range   Yeast Wet Prep HPF POC NONE SEEN  NONE SEEN   Trich, Wet Prep NONE SEEN  NONE SEEN   Clue Cells Wet Prep HPF POC NONE SEEN  NONE SEEN   WBC, Wet Prep HPF POC TOO NUMEROUS TO COUNT (*) NONE SEEN    MAU Course  Procedures None  MDM +FHTs UA, Wet prep today Urine culture pending Patient given 10 mg Flexeril and 1000 mg Tylenol - patient reports some improvement in pain TOCO placed. No evidence of contractions or uterine irritability  Assessment and Plan  A: Round ligament pain  P: Discharge home Rx for Flexeril sent to patient's pharmacy Patient given note  with work restrictions for lifting Discussed warm baths, tylenol PRN and abdominal binder for pain Patient advised to keep appointment to start prenatal care with [facility] clinic as scheduled Patient may return  to MAU as needed or if her condition were to change or worsen  Freddi Starr, PA-C  02/19/2013, 9:32 PM

## 2013-02-19 NOTE — MAU Note (Signed)
LLQ pain that is sharp and intermittent. 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 One episode of red spotting this morning in underwear. States doesn't feel like she's having contractions.

## 2013-02-20 NOTE — MAU Provider Note (Signed)
Attestation of Attending Supervision of Advanced Practitioner (CNM/NP): Evaluation and management procedures were performed by the Advanced Practitioner under my supervision and collaboration. I have reviewed the Advanced Practitioner's note and chart, and I agree with the management and plan.  Fareedah Mahler H. 5:09 AM

## 2013-02-21 LAB — URINE CULTURE: Culture: NO GROWTH

## 2013-02-22 LAB — OB RESULTS CONSOLE GC/CHLAMYDIA: Gonorrhea: NEGATIVE

## 2013-02-27 ENCOUNTER — Inpatient Hospital Stay (HOSPITAL_COMMUNITY)
Admission: AD | Admit: 2013-02-27 | Discharge: 2013-02-27 | Disposition: A | Discharge status: Home / Self Care | Payer: Self-pay | Source: Ambulatory Visit | Attending: Obstetrics & Gynecology | Admitting: Obstetrics & Gynecology

## 2013-02-27 ENCOUNTER — Inpatient Hospital Stay (HOSPITAL_COMMUNITY): Payer: Self-pay

## 2013-02-27 ENCOUNTER — Encounter (HOSPITAL_COMMUNITY): Payer: Self-pay | Admitting: Family

## 2013-02-27 DIAGNOSIS — O99891 Other specified diseases and conditions complicating pregnancy: Secondary | ICD-10-CM | POA: Insufficient documentation

## 2013-02-27 DIAGNOSIS — K429 Umbilical hernia without obstruction or gangrene: Secondary | ICD-10-CM | POA: Insufficient documentation

## 2013-02-27 DIAGNOSIS — R109 Unspecified abdominal pain: Secondary | ICD-10-CM | POA: Insufficient documentation

## 2013-02-27 MED ORDER — OXYCODONE-ACETAMINOPHEN 5-325 MG PO TABS: 1.0000 | ORAL_TABLET | Freq: Four times a day (QID) | ORAL | Status: DC | PRN

## 2013-02-27 MED ORDER — CYCLOBENZAPRINE HCL 5 MG PO TABS
5.0000 mg | ORAL_TABLET | Freq: Once | ORAL | Status: AC
  Administered 2013-02-27: 5 mg via ORAL
  Filled 2013-02-27: qty 1

## 2013-02-27 MED ORDER — DOCUSATE SODIUM 100 MG PO CAPS: 100.0000 mg | ORAL_CAPSULE | Freq: Two times a day (BID) | ORAL | Status: DC | PRN

## 2013-02-27 NOTE — MAU Note (Signed)
Patient states she has been diagnosed with an umbilical hernia with a previous pregnancy. States it came out last night and patient has been having a lot of pain. Denies bleeding or leaking and feeling fetal movement.

## 2013-02-27 NOTE — MAU Provider Note (Signed)
History     CSN: [id number]  Arrival date and time: 02/27/13 1807   First Provider Initiated Contact with Patient 02/27/13 1854      Chief Complaint  Patient presents with  . Abdominal Pain   HPI - Patient presents reporting increased umbilical hernia size and pain after lifting someone at work overnight. This was an emergency situation which is why she lifted despite being on weight-bearing precautions (due to reported spotting and subchorionic hemorrhage). Her abdomen has felt firm most of the day and lower right abdomen has been very tender. Denies bowel changes, fevers, chills, changes in PO, nausea, or vomiting. Feels it is difficult to get a deep breath she thins because she is worried. She reports not having had BM or flatus since the incident.  She denies vaginal bleeding, gush of fluid, or contractions. Reports feeling baby move.   She has her first prenatal appt scheduled at [facility] 7/23 at 830AM.  OB history: Been getting care at [facility], had subchorionic hemorrhage at 12 weeks with lots of vaginal bleeding, stopped on its own, then spotting last week and she was told outer cerix was a little open and to rest and not bear heavy weight.  [id number]  Miscarriage at 12 weeks and tubal pregnancy at 12 weeks. Never had high blood presure or diabetes issues. All NSVD. All full term.  Past Medical History  Diagnosis Date  . Strep throat   . Ovarian cyst   . Tubal pregnancy 2005  . Umbilical hernia   . Abnormal Pap smear     LEEP    Past Surgical History  Procedure Laterality Date  . Appendectomy    . Laparoscopy for ectopic pregnancy      right tube removed  . Leep      Family History  Problem Relation Age of Onset  . Alcohol abuse Neg Hx   . Arthritis Neg Hx   . Birth defects Neg Hx   . Cancer Neg Hx   . Depression Neg Hx   . Drug abuse Neg Hx   . Early death Neg Hx   . Hearing loss Neg Hx   . Heart disease Neg Hx   . Hyperlipidemia Neg Hx   .  Kidney disease Neg Hx   . Learning disabilities Neg Hx   . Mental illness Neg Hx   . Mental retardation Neg Hx   . Miscarriages / Stillbirths Neg Hx   . Stroke Neg Hx   . Vision loss Neg Hx   . Hypertension Mother   . Diabetes Father   . Hypertension Father   . Asthma Father   . Asthma Paternal Aunt   . COPD Paternal Aunt   . Diabetes Paternal Aunt   . Hypertension Paternal Aunt   . Diabetes Paternal Grandmother   . Asthma Paternal Grandmother     History  Substance Use Topics  . Smoking status: Former Smoker -- 0.50 packs/day    Types: Cigarettes    Quit date: 10/13/2012  . Smokeless tobacco: Never Used     Comment: quit with preg  . Alcohol Use: No    Allergies: No Known Allergies  Prescriptions prior to admission  Medication Sig Dispense Refill  . acetaminophen (TYLENOL) 500 MG tablet Take 1,000 mg by mouth every 4 (four) hours as needed for pain.       . cyclobenzaprine (FLEXERIL) 10 MG tablet Take 1 tablet (10 mg total) by mouth 2 (two) times daily as needed for  muscle spasms.  20 tablet  0  . nitrofurantoin, macrocrystal-monohydrate, (MACROBID) 100 MG capsule Take 1 capsule (100 mg total) by mouth 2 (two) times daily.  10 capsule  0  . Prenatal Vit-Fe Fumarate-FA (PRENATAL MULTIVITAMIN) TABS Take 1 tablet by mouth daily.        PNV regularly, others medications as needed (tylenol prn pain, has not picked up flexeril yet, which is for pain at last visit). Finished macrobid for recent UTI.  ROS - per HPI. Physical Exam   Blood pressure 114/63, pulse 79, temperature 98.2 F (36.8 C), temperature source Oral, resp. rate 16, last menstrual period 10/03/2012, SpO2 100.00%.  Physical Exam  Constitutional: She appears well-developed and well-nourished.  HENT:  Head: Normocephalic and atraumatic.  Eyes: Conjunctivae and EOM are normal.  Neck: Normal range of motion. Neck supple.  Cardiovascular: Normal rate, regular rhythm and normal heart sounds.   Respiratory:  Effort normal. No stridor. No respiratory distress. She has no wheezes. She has no rales.  GI: Soft. She exhibits no distension. There is no rebound.  Hypoactive bowel sounds. Right lower quadrant with severe tenderness. Right of umbilicus, feel soft protuberance with small amount of pressure, which does not easily reduce and is very tender. No erythema or swelling or warmth. Abdomen is non-peritoneal.  Musculoskeletal: Normal range of motion. She exhibits no edema and no tenderness.  Neurological: She is alert.  Skin: Skin is warm and dry. No erythema.  Psychiatric: She has a normal mood and affect. Her behavior is normal.   MAU Course  Procedures  MDM -Soft tissue ultrasound  Assessment and Plan  27 y.o. [id number] at [redacted]w[redacted]d by LMP here for increased size and pain of umbilical hernia. Afebrile with stable vitals and clinically appears only mildly uncomfortable. - Will check US soft tissue to evaluate for incarceration with pain, though no fever or erythema makes this less likely. If indeterminate, would need to consider MRI. - Flexeril 5mg  PO for pain.  Ultrasound had not been done as of 745pm when signed out Dr. Thad Ranger who is on tonight and will resume care.  Simone Curia 02/27/2013, 6:54 PM   Care assumed at 20:00 pm.  Patient returned from ultrasound s/p flexeril. Still sore and tender around umbilicus.  LIMITED ULTRASOUND OF ABDOMINAL SOFT TISSUES  Technique: Ultrasound examination of the abdominal soft tissues  was performed in the area of clinical concern.  Comparison: Abdominal CT 05/10/2012  Findings: The area of concern is just above the umbilicus. There  is a subtle hypoechoic structure that may be extending anteriorly  from the abdominal wall. There are a few vessels in this area. A  ventral hernia containing vessels cannot be excluded. Bowel  structures are not clearly identified.  IMPRESSION:  Hypoechoic structure in the area of concern. This may represent a   ventral hernia containing vessels and fat. If further imaging of  this area is needed, consider MRI of the abdomen without contrast.   On exam, patient is tender just superior to and right of umbilicus. No palpable defect. No viscera felt protruding from umbilicus or surrounding tissue.   A/P 27 y.o. [id number] at [redacted]w[redacted]d with abdominal pain and umbilical hernia - Strained muscle and likely small tear of existing hernia - No evidence of incarceration or bowel obstruction. - Discussed with [facility] (Dr. Purnell Shoemaker) - lifting restrictions, treat as muscle strain with heat/ice/rest.  - Stable for discharge home. Letter for work with lifting restrictions provided. F/u with [facility] for prenatal care. Precautions  regarding incarceration discussed. Percocet for pain, colace, tylenol.  FHTs:  150 by doppler  Napoleon Form, MD

## 2013-02-27 NOTE — MAU Note (Addendum)
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 Denies VB, LOF. Reports her abdomen has felt firm most of the day.  First prenatal appt scheduled at [facility] clinic, 7/23 at 0830.

## 2013-03-06 ENCOUNTER — Encounter (HOSPITAL_COMMUNITY): Payer: Self-pay | Admitting: Emergency Medicine

## 2013-03-06 ENCOUNTER — Emergency Department (HOSPITAL_COMMUNITY): Payer: Self-pay

## 2013-03-06 ENCOUNTER — Emergency Department (HOSPITAL_COMMUNITY)
Admission: EM | Admit: 2013-03-06 | Discharge: 2013-03-06 | Disposition: A | Discharge status: Home / Self Care | Payer: Self-pay | Attending: Emergency Medicine | Admitting: Emergency Medicine

## 2013-03-06 DIAGNOSIS — Z8742 Personal history of other diseases of the female genital tract: Secondary | ICD-10-CM | POA: Insufficient documentation

## 2013-03-06 DIAGNOSIS — Y929 Unspecified place or not applicable: Secondary | ICD-10-CM | POA: Insufficient documentation

## 2013-03-06 DIAGNOSIS — Z8719 Personal history of other diseases of the digestive system: Secondary | ICD-10-CM | POA: Insufficient documentation

## 2013-03-06 DIAGNOSIS — Y990 Civilian activity done for income or pay: Secondary | ICD-10-CM | POA: Insufficient documentation

## 2013-03-06 DIAGNOSIS — Y9389 Activity, other specified: Secondary | ICD-10-CM | POA: Insufficient documentation

## 2013-03-06 DIAGNOSIS — Z87891 Personal history of nicotine dependence: Secondary | ICD-10-CM | POA: Insufficient documentation

## 2013-03-06 DIAGNOSIS — S93409A Sprain of unspecified ligament of unspecified ankle, initial encounter: Secondary | ICD-10-CM | POA: Insufficient documentation

## 2013-03-06 DIAGNOSIS — X500XXA Overexertion from strenuous movement or load, initial encounter: Secondary | ICD-10-CM | POA: Insufficient documentation

## 2013-03-06 DIAGNOSIS — S93401A Sprain of unspecified ligament of right ankle, initial encounter: Secondary | ICD-10-CM

## 2013-03-06 MED ORDER — HYDROCODONE-ACETAMINOPHEN 5-325 MG PO TABS: 1.0000 | ORAL_TABLET | Freq: Four times a day (QID) | ORAL | Status: DC | PRN

## 2013-03-06 MED ORDER — ACETAMINOPHEN 325 MG PO TABS
650.0000 mg | ORAL_TABLET | Freq: Once | ORAL | Status: AC
  Administered 2013-03-06: 650 mg via ORAL
  Filled 2013-03-06: qty 2

## 2013-03-06 MED ORDER — IBUPROFEN 400 MG PO TABS: 600.0000 mg | ORAL_TABLET | Freq: Once | ORAL | Status: DC

## 2013-03-06 NOTE — Progress Notes (Signed)
Orthopedic Tech Progress Note Patient Details:  Jade Hancock [birth year] [id number]  Ortho Devices Type of Ortho Device: ASO;Crutches Ortho Device/Splint Location: RLE Ortho Device/Splint Interventions: Ordered;Application   Jennye Moccasin 03/06/2013, 3:23 PM

## 2013-03-06 NOTE — ED Provider Notes (Addendum)
History    CSN: [id number] Arrival date & time 03/06/13  1407  First MD Initiated Contact with Patient 03/06/13 1454     Chief Complaint  Patient presents with  . Ankle Pain   (Consider location/radiation/quality/duration/timing/severity/associated sxs/prior Treatment) Patient is a 27 y.o. female presenting with ankle pain. The history is provided by the patient.  Ankle Pain Associated symptoms: no fever   pt c/o right ankle pain. 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 Constant. Dull. Moderate. Non radiating. Worse w palpation, walking. No knee pain. No 5th mt pain. Skin intact. No associated numbness.     Past Medical History  Diagnosis Date  . Strep throat   . Ovarian cyst   . Tubal pregnancy 2005  . Umbilical hernia   . Abnormal Pap smear     LEEP   Past Surgical History  Procedure Laterality Date  . Appendectomy    . Laparoscopy for ectopic pregnancy      right tube removed  . Leep     Family History  Problem Relation Age of Onset  . Alcohol abuse Neg Hx   . Arthritis Neg Hx   . Birth defects Neg Hx   . Cancer Neg Hx   . Depression Neg Hx   . Drug abuse Neg Hx   . Early death Neg Hx   . Hearing loss Neg Hx   . Heart disease Neg Hx   . Hyperlipidemia Neg Hx   . Kidney disease Neg Hx   . Learning disabilities Neg Hx   . Mental illness Neg Hx   . Mental retardation Neg Hx   . Miscarriages / Stillbirths Neg Hx   . Stroke Neg Hx   . Vision loss Neg Hx   . Hypertension Mother   . Diabetes Father   . Hypertension Father   . Asthma Father   . Asthma Paternal Aunt   . COPD Paternal Aunt   . Diabetes Paternal Aunt   . Hypertension Paternal Aunt   . Diabetes Paternal Grandmother   . Asthma Paternal Grandmother    History  Substance Use Topics  . Smoking status: Former Smoker -- 0.50 packs/day    Types: Cigarettes    Quit date: 10/13/2012  . Smokeless tobacco: Never Used      Comment: quit with preg  . Alcohol Use: No   OB History   Grav Para Term Preterm Abortions TAB SAB Ect Mult Living   7 4 4  2  1 1  4      Review of Systems  Constitutional: Negative for fever.  Musculoskeletal: Positive for joint swelling.  Skin: Negative for wound.  Neurological: Negative for weakness and numbness.    Allergies  Review of patient's allergies indicates no known allergies.  Home Medications   Current Outpatient Rx  Name  Route  Sig  Dispense  Refill  . acetaminophen (TYLENOL) 500 MG tablet   Oral   Take 1,000 mg by mouth every 4 (four) hours as needed for pain.           BP 128/67  Pulse 80  Temp(Src) 98.3 F (36.8 C) (Oral)  Resp 16  SpO2 96%  LMP 10/03/2012 Physical Exam  Nursing note and vitals reviewed. Constitutional: She appears well-developed and well-nourished. No distress.  HENT:  Head: Atraumatic.  Eyes: Conjunctivae are normal. No scleral icterus.  Neck: Neck supple. No tracheal deviation present.  Cardiovascular: Normal rate.   Pulmonary/Chest: Effort normal. No respiratory distress.  Abdominal: Normal appearance.  Musculoskeletal:  sts and tenderness lateral malleolus right ankle. Ankle stable. Distal pulses palp. No prox tib fib or knee tenderness. No 5th mt tenderness. Skin intact.   Neurological: She is alert.  Right foot nvi.   Skin: Skin is warm and dry. No rash noted.  Psychiatric: She has a normal mood and affect.    ED Course  Procedures (including critical care time)  Dg Ankle Complete Right  03/06/2013   *RADIOLOGY REPORT*  Clinical Data: Ankle pain from twisting it today.  Swelling throughout.  RIGHT ANKLE - COMPLETE 3+ VIEW  Comparison: None.  Findings: Normal bony mineralization.  The angle is located.  No acute or healing fracture is identified.  There is mild soft tissue swelling about the ankle.  IMPRESSION: No soft tissue swelling.  No acute bony abnormality identified.   Original Report Authenticated By:  Britta Mccreedy, M.D.      MDM  Ice. Motrin po. Crutches. [place]so.  Xrays.  Reviewed nursing notes and prior charts for additional history.   Discussed xrays w pt.   Had given instructions re tylenol.  Pt requests something stronger for pain at home, states tylenol hadnt helped.   Discussed limiting pain rx in preg, however pt notes pain not controlled w just tylenol, requests rx - will give small quantity norco for pain, also discussed elevation/ice.  Request work note.    Suzi Roots, MD 03/06/13 [phone]

## 2013-03-06 NOTE — ED Notes (Signed)
Pt. Stated, i got up from the table at work fast and my ankle popped. Its hurts to walk, lift it up.

## 2013-03-08 ENCOUNTER — Encounter: Payer: Self-pay | Admitting: Family Medicine

## 2013-03-08 ENCOUNTER — Ambulatory Visit (INDEPENDENT_AMBULATORY_CARE_PROVIDER_SITE_OTHER): Payer: Self-pay | Admitting: Family Medicine

## 2013-03-08 VITALS — BP 113/71 | Temp 98.7°F | Wt 145.7 lb

## 2013-03-08 DIAGNOSIS — K219 Gastro-esophageal reflux disease without esophagitis: Secondary | ICD-10-CM | POA: Insufficient documentation

## 2013-03-08 DIAGNOSIS — O0932 Supervision of pregnancy with insufficient antenatal care, second trimester: Secondary | ICD-10-CM

## 2013-03-08 DIAGNOSIS — O4692 Antepartum hemorrhage, unspecified, second trimester: Secondary | ICD-10-CM

## 2013-03-08 DIAGNOSIS — K429 Umbilical hernia without obstruction or gangrene: Secondary | ICD-10-CM

## 2013-03-08 DIAGNOSIS — O0930 Supervision of pregnancy with insufficient antenatal care, unspecified trimester: Secondary | ICD-10-CM

## 2013-03-08 DIAGNOSIS — Z9889 Other specified postprocedural states: Secondary | ICD-10-CM

## 2013-03-08 DIAGNOSIS — O4690 Antepartum hemorrhage, unspecified, unspecified trimester: Secondary | ICD-10-CM | POA: Insufficient documentation

## 2013-03-08 LAB — POCT URINALYSIS DIP (DEVICE)
Glucose, UA: NEGATIVE mg/dL
Ketones, ur: NEGATIVE mg/dL
Specific Gravity, Urine: 1.015 (ref 1.005–1.030)
Urobilinogen, UA: 1 mg/dL (ref 0.0–1.0)

## 2013-03-08 MED ORDER — PANTOPRAZOLE SODIUM 40 MG PO TBEC: 40.0000 mg | DELAYED_RELEASE_TABLET | Freq: Every day | ORAL | Status: DC

## 2013-03-08 NOTE — Progress Notes (Signed)
Subjective:    Jade Hancock is being seen today for her first obstetrical visit.  This is not a planned pregnancy. She is at [redacted]w[redacted]d gestation. Her obstetrical history is significant for bleeding earlier in pregnancy, former smoker and LEEP in 2009. Relationship with FOB: significant other, living together. Patient does intend to breast feed. Plans BTL for contraception.  History of ectopic pregnancy with R salpingectomy. Pregnancy history fully reviewed. Dating by LMP 10/03/12 with EDD 07/10/13.  Confirmed by [redacted]w[redacted]d ultrasound. Bleeding at 7.5 and spotting at 16 and 19 weeks. Had subchorionic hemorrhage noted on ultrasound 4/4 and 4/15. She states she was told that her cervix was open externally and placed on lifting restrictions at one of these visits (cannot find evidence in EMR). GC/chlamydia negative on 6/28 and wet prep with few clue cells.  She denies current bleeding, leakage of fluid, vaginal discharge, dysuria. Feeling fetal movement. Has had abdominal pain throughout pregnancy. This has been attributed to round ligament pain. However, on 7/14 she was seen for pain related to an umbilical hernia exacerbated by lifting at work. It was not incarcerated at the time but deemed to have been strained. 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 She denies nausea, vomiting, diarrhea, constipation. She is able to eat and drink, is passing gas and has normal bowel movements. Her only GI complaint is severe heartburn, for which she takes [place].  She has had 7 pregnancies including four term vaginal deliveries, an ectopic pregnancy and an 8 week miscarriage. Her ectopic resulted in R salpingectomy. She would like a tubal ligation following delivery of this baby. 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 No follow up until January 2014, with negative pap, negative HPV (08/31/12).  Menstrual History: OB  History   Grav Para Term Preterm Abortions TAB SAB Ect Mult Living   7 4 4  2  1 1  4        Patient's last menstrual period was 10/03/2012.    The following portions of the patient's history were reviewed and updated as appropriate: allergies, current medications, past family history, past medical history, past social history, past surgical history and problem list.  Review of Systems Pertinent items are noted in HPI.    Objective:   Filed Vitals:   03/08/13 0847  BP: 113/71  Temp: 98.7 F (37.1 C)    GEN:  WNWD, no distress HEENT:  NCAT, EOMI, conjunctiva clear NECK:  Supple, non-tender, no thyromegaly, trachea midline CV: RRR, no murmur RESP:  CTAB ABD:  Soft, non-tender, no guarding or rebound, normal bowel sounds EXTREM:  Warm, well perfused, no edema or tenderness NEURO:  Alert, oriented, no focal deficits GU:  Normal external genitalia, normal cervix, no CMT or adnexal tenderness. Cervix is FT at external os with tight band/rim.    Assessment:    Pregnancy at 22 and 2/7 weeks   Reflux History of LEEP Umbilical hernia Multiparity - desires permanent sterilization Bleeding in pregnanc - first and second trimester  Plan:    Initial labs drawn. Prenatal vitamins. Problem list reviewed and updated. AFP3 discussed: too late. Role of ultrasound in pregnancy discussed; fetal survey: ordered. Amniocentesis discussed: not indicated. Protonix for reflux Complete ultrasound for anatomy, check cervical length Refer to surgery for evaluation of umbilical hernia, discussed risks and signs of incarceration, continue to rest and treat as muscle strain.  Sign BTL consent when Medicaid card available. Preterm labor precautions, bleeding precautions Follow up in 4 weeks. 50% of 45 min visit spent on counseling and coordination of care.

## 2013-03-08 NOTE — Progress Notes (Signed)
Has not gotten medicaid card yet- will wait to sign btl papers.   Also wants to wait on surgical referral because per [facility] not covered by pregnancy medicaid- will have to  Pay $226 at first visit.

## 2013-03-08 NOTE — Progress Notes (Signed)
Pulse- 73 Pt went to [facility]U for braxton hicks Weight gain 25-35lb  New ob packet given

## 2013-03-08 NOTE — Patient Instructions (Signed)
Pregnancy - Second Trimester The second trimester of pregnancy (3 to 6 months) is a period of rapid growth for you and your baby. At the end of the sixth month, your baby is about 9 inches long and weighs 1 1/2 pounds. You will begin to feel the baby move between 18 and 20 weeks of the pregnancy. This is called quickening. Weight gain is faster. A clear fluid (colostrum) may leak out of your breasts. You may feel small contractions of the womb (uterus). This is known as false labor or Braxton-Hicks contractions. This is like a practice for labor when the baby is ready to be born. Usually, the problems with morning sickness have usually passed by the end of your first trimester. Some women develop small dark blotches (called cholasma, mask of pregnancy) on their face that usually goes away after the baby is born. Exposure to the sun makes the blotches worse. Acne may also develop in some pregnant women and pregnant women who have acne, may find that it goes away. PRENATAL EXAMS  Blood work may continue to be done during prenatal exams. These tests are done to check on your health and the probable health of your baby. Blood work is used to follow your blood levels (hemoglobin). Anemia (low hemoglobin) is common during pregnancy. Iron and vitamins are given to help prevent this. You will also be checked for diabetes between 24 and 28 weeks of the pregnancy. Some of the previous blood tests may be repeated.  The size of the uterus is measured during each visit. This is to make sure that the baby is continuing to grow properly according to the dates of the pregnancy.  Your blood pressure is checked every prenatal visit. This is to make sure you are not getting toxemia.  Your urine is checked to make sure you do not have an infection, diabetes or protein in the urine.  Your weight is checked often to make sure gains are happening at the suggested rate. This is to ensure that both you and your baby are  growing normally.  Sometimes, an ultrasound is performed to confirm the proper growth and development of the baby. This is a test which bounces harmless sound waves off the baby so your caregiver can more accurately determine due dates. Sometimes, a test is done on the amniotic fluid surrounding the baby. This test is called an amniocentesis. The amniotic fluid is obtained by sticking a needle into the belly (abdomen). This is done to check the chromosomes in instances where there is a concern about possible genetic problems with the baby. It is also sometimes done near the end of pregnancy if an early delivery is required. In this case, it is done to help make sure the baby's lungs are mature enough for the baby to live outside of the womb. CHANGES OCCURING IN THE SECOND TRIMESTER OF PREGNANCY Your body goes through many changes during pregnancy. They vary from person to person. Talk to your caregiver about changes you notice that you are concerned about.  During the second trimester, you will likely have an increase in your appetite. It is normal to have cravings for certain foods. This varies from person to person and pregnancy to pregnancy.  Your lower abdomen will begin to bulge.  You may have to urinate more often because the uterus and baby are pressing on your bladder. It is also common to get more bladder infections during pregnancy. You can help this by drinking lots of fluids   and emptying your bladder before and after intercourse.  You may begin to get stretch marks on your hips, abdomen, and breasts. These are normal changes in the body during pregnancy. There are no exercises or medicines to take that prevent this change.  You may begin to develop swollen and bulging veins (varicose veins) in your legs. Wearing support hose, elevating your feet for 15 minutes, 3 to 4 times a day and limiting salt in your diet helps lessen the problem.  Heartburn may develop as the uterus grows and  pushes up against the stomach. Antacids recommended by your caregiver helps with this problem. Also, eating smaller meals 4 to 5 times a day helps.  Constipation can be treated with a stool softener or adding bulk to your diet. Drinking lots of fluids, and eating vegetables, fruits, and whole grains are helpful.  Exercising is also helpful. If you have been very active up until your pregnancy, most of these activities can be continued during your pregnancy. If you have been less active, it is helpful to start an exercise program such as walking.  Hemorrhoids may develop at the end of the second trimester. Warm sitz baths and hemorrhoid cream recommended by your caregiver helps hemorrhoid problems.  Backaches may develop during this time of your pregnancy. Avoid heavy lifting, wear low heal shoes, and practice good posture to help with backache problems.  Some pregnant women develop tingling and numbness of their hand and fingers because of swelling and tightening of ligaments in the wrist (carpel tunnel syndrome). This goes away after the baby is born.  As your breasts enlarge, you may have to get a bigger bra. Get a comfortable, cotton, support bra. Do not get a nursing bra until the last month of the pregnancy if you will be nursing the baby.  You may get a dark line from your belly button to the pubic area called the linea nigra.  You may develop rosy cheeks because of increase blood flow to the face.  You may develop spider looking lines of the face, neck, arms, and chest. These go away after the baby is born. HOME CARE INSTRUCTIONS   It is extremely important to avoid all smoking, herbs, alcohol, and unprescribed drugs during your pregnancy. These chemicals affect the formation and growth of the baby. Avoid these chemicals throughout the pregnancy to ensure the delivery of a healthy infant.  Most of your home care instructions are the same as suggested for the first trimester of your  pregnancy. Keep your caregiver's appointments. Follow your caregiver's instructions regarding medicine use, exercise, and diet.  During pregnancy, you are providing food for you and your baby. Continue to eat regular, well-balanced meals. Choose foods such as meat, fish, milk and other low fat dairy products, vegetables, fruits, and whole-grain breads and cereals. Your caregiver will tell you of the ideal weight gain.  A physical sexual relationship may be continued up until near the end of pregnancy if there are no other problems. Problems could include early (premature) leaking of amniotic fluid from the membranes, vaginal bleeding, abdominal pain, or other medical or pregnancy problems.  Exercise regularly if there are no restrictions. Check with your caregiver if you are unsure of the safety of some of your exercises. The greatest weight gain will occur in the last 2 trimesters of pregnancy. Exercise will help you:  Control your weight.  Get you in shape for labor and delivery.  Lose weight after you have the baby.  Wear   a good support or jogging bra for breast tenderness during pregnancy. This may help if worn during sleep. Pads or tissues may be used in the bra if you are leaking colostrum.  Do not use hot tubs, steam rooms or saunas throughout the pregnancy.  Wear your seat belt at all times when driving. This protects you and your baby if you are in an accident.  Avoid raw meat, uncooked cheese, cat litter boxes, and soil used by cats. These carry germs that can cause birth defects in the baby.  The second trimester is also a good time to visit your dentist for your dental health if this has not been done yet. Getting your teeth cleaned is okay. Use a soft toothbrush. Brush gently during pregnancy.  It is easier to leak urine during pregnancy. Tightening up and strengthening the pelvic muscles will help with this problem. Practice stopping your urination while you are going to the  bathroom. These are the same muscles you need to strengthen. It is also the muscles you would use as if you were trying to stop from passing gas. You can practice tightening these muscles up 10 times a set and repeating this about 3 times per day. Once you know what muscles to tighten up, do not perform these exercises during urination. It is more likely to contribute to an infection by backing up the urine.  Ask for help if you have financial, counseling, or nutritional needs during pregnancy. Your caregiver will be able to offer counseling for these needs as well as refer you for other special needs.  Your skin may become oily. If so, wash your face with mild soap, use non-greasy moisturizer and oil or cream based makeup. MEDICINES AND DRUG USE IN PREGNANCY  Take prenatal vitamins as directed. The vitamin should contain 1 milligram of folic acid. Keep all vitamins out of reach of children. Only a couple vitamins or tablets containing iron may be fatal to a baby or young child when ingested.  Avoid use of all medicines, including herbs, over-the-counter medicines, not prescribed or suggested by your caregiver. Only take over-the-counter or prescription medicines for pain, discomfort, or fever as directed by your caregiver. Do not use aspirin.  Let your caregiver also know about herbs you may be using.  Alcohol is related to a number of birth defects. This includes fetal alcohol syndrome. All alcohol, in any form, should be avoided completely. Smoking will cause low birth rate and premature babies.  Street or illegal drugs are very harmful to the baby. They are absolutely forbidden. A baby born to an addicted mother will be addicted at birth. The baby will go through the same withdrawal an adult does. SEEK MEDICAL CARE IF:  You have any concerns or worries during your pregnancy. It is better to call with your questions if you feel they cannot wait, rather than worry about them. SEEK IMMEDIATE  MEDICAL CARE IF:   An unexplained oral temperature above 102 F (38.9 C) develops, or as your caregiver suggests.  You have leaking of fluid from the vagina (birth canal). If leaking membranes are suspected, take your temperature and tell your caregiver of this when you call.  There is vaginal spotting, bleeding, or passing clots. Tell your caregiver of the amount and how many pads are used. Light spotting in pregnancy is common, especially following intercourse.  You develop a bad smelling vaginal discharge with a change in the color from clear to white.  You continue to feel   sick to your stomach (nauseated) and have no relief from remedies suggested. You vomit blood or coffee ground-like materials.  You lose more than 2 pounds of weight or gain more than 2 pounds of weight over 1 week, or as suggested by your caregiver.  You notice swelling of your face, hands, feet, or legs.  You get exposed to German measles and have never had them.  You are exposed to fifth disease or chickenpox.  You develop belly (abdominal) pain. Round ligament discomfort is a common non-cancerous (benign) cause of abdominal pain in pregnancy. Your caregiver still must evaluate you.  You develop a bad headache that does not go away.  You develop fever, diarrhea, pain with urination, or shortness of breath.  You develop visual problems, blurry, or double vision.  You fall or are in a car accident or any kind of trauma.  There is mental or physical violence at home. Document Released: 07/28/2001 Document Revised: 04/27/2012 Document Reviewed: 01/30/2009 [employer] Patient Information 2014 [employer].  

## 2013-03-09 LAB — OBSTETRIC PANEL
Basophils Relative: 0 % (ref 0–1)
Eosinophils Absolute: 0.6 10*3/uL (ref 0.0–0.7)
Hemoglobin: 12.1 g/dL (ref 12.0–15.0)
Hepatitis B Surface Ag: NEGATIVE
MCHC: 34.4 g/dL (ref 30.0–36.0)
Monocytes Relative: 5 % (ref 3–12)
Neutro Abs: 6.6 10*3/uL (ref 1.7–7.7)
Neutrophils Relative %: 72 % (ref 43–77)
Platelets: 296 10*3/uL (ref 150–400)
RBC: 3.78 MIL/uL — ABNORMAL LOW (ref 3.87–5.11)
Rh Type: POSITIVE

## 2013-03-09 LAB — CULTURE, OB URINE: Colony Count: 5000

## 2013-03-09 LAB — GLUCOSE TOLERANCE, 1 HOUR (50G) W/O FASTING: Glucose, 1 Hour GTT: 106 mg/dL (ref 70–140)

## 2013-03-13 ENCOUNTER — Encounter: Payer: Self-pay | Admitting: Family Medicine

## 2013-03-13 ENCOUNTER — Ambulatory Visit (HOSPITAL_COMMUNITY)
Admission: RE | Admit: 2013-03-13 | Discharge: 2013-03-13 | Disposition: A | Discharge status: Home / Self Care | Payer: Self-pay | Source: Ambulatory Visit | Attending: Family Medicine | Admitting: Family Medicine

## 2013-03-13 DIAGNOSIS — O09299 Supervision of pregnancy with other poor reproductive or obstetric history, unspecified trimester: Secondary | ICD-10-CM | POA: Insufficient documentation

## 2013-03-13 DIAGNOSIS — O0930 Supervision of pregnancy with insufficient antenatal care, unspecified trimester: Secondary | ICD-10-CM | POA: Insufficient documentation

## 2013-03-13 DIAGNOSIS — O0932 Supervision of pregnancy with insufficient antenatal care, second trimester: Secondary | ICD-10-CM

## 2013-03-14 ENCOUNTER — Encounter: Payer: Self-pay | Admitting: Family Medicine

## 2013-03-15 ENCOUNTER — Inpatient Hospital Stay (HOSPITAL_COMMUNITY)
Admission: AD | Admit: 2013-03-15 | Discharge: 2013-03-16 | Disposition: A | Discharge status: Home / Self Care | Payer: Self-pay | Source: Ambulatory Visit | Attending: Obstetrics and Gynecology | Admitting: Obstetrics and Gynecology

## 2013-03-15 ENCOUNTER — Encounter (HOSPITAL_COMMUNITY): Payer: Self-pay | Admitting: *Deleted

## 2013-03-15 DIAGNOSIS — R109 Unspecified abdominal pain: Secondary | ICD-10-CM | POA: Insufficient documentation

## 2013-03-15 DIAGNOSIS — O26899 Other specified pregnancy related conditions, unspecified trimester: Secondary | ICD-10-CM

## 2013-03-15 DIAGNOSIS — O4700 False labor before 37 completed weeks of gestation, unspecified trimester: Secondary | ICD-10-CM | POA: Insufficient documentation

## 2013-03-15 LAB — URINALYSIS, ROUTINE W REFLEX MICROSCOPIC
Glucose, UA: NEGATIVE mg/dL
Ketones, ur: 15 mg/dL — AB
Leukocytes, UA: NEGATIVE
Nitrite: NEGATIVE
Specific Gravity, Urine: >1.03 — ABNORMAL HIGH (ref 1.005–1.030)
pH: 5.5 (ref 5.0–8.0)

## 2013-03-15 LAB — WET PREP, GENITAL
Clue Cells Wet Prep HPF POC: NONE SEEN
Trich, Wet Prep: NONE SEEN
Yeast Wet Prep HPF POC: NONE SEEN

## 2013-03-15 LAB — FETAL FIBRONECTIN: Fetal Fibronectin: NEGATIVE

## 2013-03-15 MED ORDER — IBUPROFEN 600 MG PO TABS
600.0000 mg | ORAL_TABLET | Freq: Four times a day (QID) | ORAL | Status: DC | PRN
  Administered 2013-03-15: 600 mg via ORAL
  Filled 2013-03-15: qty 1

## 2013-03-15 MED ORDER — NIFEDIPINE 10 MG PO CAPS
10.0000 mg | ORAL_CAPSULE | Freq: Four times a day (QID) | ORAL | Status: DC
  Administered 2013-03-15: 10 mg via ORAL
  Filled 2013-03-15: qty 1

## 2013-03-15 NOTE — MAU Provider Note (Signed)
History     CSN: [id number]  Arrival date and time: 03/15/13 2106   None     Chief Complaint  Patient presents with  . Abdominal Pain   HPI 27 y.o. [id number] at [redacted]w[redacted]d with lower abdominal pain that comes and goes since this morning. No bleeding, felt "wet" yesterday and today but no gushes of fluid. No other vaginal discharge or dysuria. But does have urinary frequency. Baby moving well. No nausea, vomiting, fever, diarrhea, constipation.   She receives care at [facility]. Has had 4 term vaginal deliveries with no complications. Had some bleeding from a small subchorionic hemorrhage earlier in this pregnancy. History of LEEP and ectopic pregnancy with R salpingectomy.  OB History   Grav Para Term Preterm Abortions TAB SAB Ect Mult Living   7 4 4  2  1 1  4       Past Medical History  Diagnosis Date  . Strep throat   . Ovarian cyst   . Tubal pregnancy 2005  . Umbilical hernia   . Abnormal Pap smear     LEEP    Past Surgical History  Procedure Laterality Date  . Appendectomy    . Laparoscopy for ectopic pregnancy      right tube removed  . Leep      Family History  Problem Relation Age of Onset  . Alcohol abuse Neg Hx   . Arthritis Neg Hx   . Birth defects Neg Hx   . Cancer Neg Hx   . Depression Neg Hx   . Drug abuse Neg Hx   . Early death Neg Hx   . Hearing loss Neg Hx   . Heart disease Neg Hx   . Hyperlipidemia Neg Hx   . Kidney disease Neg Hx   . Learning disabilities Neg Hx   . Mental illness Neg Hx   . Mental retardation Neg Hx   . Miscarriages / Stillbirths Neg Hx   . Stroke Neg Hx   . Vision loss Neg Hx   . Hypertension Mother   . Diabetes Father   . Hypertension Father   . Asthma Father   . Asthma Paternal Aunt   . COPD Paternal Aunt   . Diabetes Paternal Aunt   . Hypertension Paternal Aunt   . Diabetes Paternal Grandmother   . Asthma Paternal Grandmother     History  Substance Use Topics  . Smoking status: Former Smoker -- 0.50 packs/day   Types: Cigarettes    Quit date: 10/13/2012  . Smokeless tobacco: Never Used     Comment: quit with preg  . Alcohol Use: No    Allergies: No Known Allergies  Prescriptions prior to admission  Medication Sig Dispense Refill  . Prenatal Vit-Fe Fumarate-FA (PRENATAL MULTIVITAMIN) TABS Take 1 tablet by mouth every morning.      Marland Kitchen HYDROcodone-acetaminophen (NORCO/VICODIN) 5-325 MG per tablet Take 1 tablet by mouth every 6 (six) hours as needed for pain.  15 tablet  0  . pantoprazole (PROTONIX) 40 MG tablet Take 1 tablet (40 mg total) by mouth daily.  30 tablet  5    ROS  See HPI  Physical Exam   Blood pressure 121/66, pulse 76, temperature 98.1 F (36.7 C), resp. rate 16, height 5\' 8"  (1.727 m), weight 66.849 kg (147 lb 6 oz), last menstrual period 10/03/2012, SpO2 100.00%.  Physical Exam GEN:  WNWD, no distress HEENT:  NCAT, EOMI, conjunctiva clear NECK:  Supple, non-tender, no thyromegaly, trachea midline CV: RRR, no  murmur RESP:  CTAB ABD:  Soft, non-tender, no guarding or rebound, normal bowel sounds EXTREM:  Warm, well perfused, no edema or tenderness NEURO:  Alert, oriented, no focal deficits GU:  Normal introitus, normal vagina, cervix visually closed. Moderate mucous discharge on exam Dilation: Closed Cervical Position: Posterior Exam by:: Dr Thad Ranger  Results for orders placed during the hospital encounter of 03/15/13 (from the past 24 hour(s))  URINALYSIS, ROUTINE W REFLEX MICROSCOPIC     Status: Abnormal   Collection Time    03/15/13  9:25 PM      Result Value Range   Color, Urine YELLOW  YELLOW   APPearance CLEAR  CLEAR   Specific Gravity, Urine >1.030 (*) 1.005 - 1.030   pH 5.5  5.0 - 8.0   Glucose, UA NEGATIVE  NEGATIVE mg/dL   Hgb urine dipstick NEGATIVE  NEGATIVE   Bilirubin Urine NEGATIVE  NEGATIVE   Ketones, ur 15 (*) NEGATIVE mg/dL   Protein, ur NEGATIVE  NEGATIVE mg/dL   Urobilinogen, UA 1.0  0.0 - 1.0 mg/dL   Nitrite NEGATIVE  NEGATIVE   Leukocytes, UA  NEGATIVE  NEGATIVE  WET PREP, GENITAL     Status: Abnormal   Collection Time    03/15/13 10:05 PM      Result Value Range   Yeast Wet Prep HPF POC NONE SEEN  NONE SEEN   Trich, Wet Prep NONE SEEN  NONE SEEN   Clue Cells Wet Prep HPF POC NONE SEEN  NONE SEEN   WBC, Wet Prep HPF POC FEW (*) NONE SEEN  GC/CHLAMYDIA PROBE AMP     Status: None   Collection Time    03/15/13 10:05 PM      Result Value Range   CT Probe RNA NEGATIVE  NEGATIVE   GC Probe RNA NEGATIVE  NEGATIVE  FETAL FIBRONECTIN     Status: None   Collection Time    03/15/13 10:05 PM      Result Value Range   Fetal Fibronectin NEGATIVE  NEGATIVE   FHTs:  140, good variablity, 10x10 accels present, occasional variability TOCO:  Occasional ctx, mostly irritability  MAU Course  Procedures   Assessment and Plan  27 y.o. [id number] at [redacted]w[redacted]d with abdominal pain - Likely round ligament pain vs Braxton Hicks - Vicodin as needed for pain at night -  Stable for discharge home - no cervical change, FFN negative - F/u as scheduled in [facility].  Napoleon Form 03/15/2013, 10:08 PM

## 2013-03-15 NOTE — MAU Note (Signed)
Pt states she has pain that comes and goes, has to urinate a lot today-and states her underwear has been damp a lot today

## 2013-03-16 DIAGNOSIS — O9989 Other specified diseases and conditions complicating pregnancy, childbirth and the puerperium: Secondary | ICD-10-CM

## 2013-03-16 DIAGNOSIS — R109 Unspecified abdominal pain: Secondary | ICD-10-CM

## 2013-03-16 MED ORDER — OXYCODONE-ACETAMINOPHEN 5-325 MG PO TABS
2.0000 | ORAL_TABLET | Freq: Once | ORAL | Status: AC
  Administered 2013-03-16: 2 via ORAL
  Filled 2013-03-16: qty 2

## 2013-03-16 MED ORDER — HYDROCODONE-ACETAMINOPHEN 5-325 MG PO TABS: 1.0000 | ORAL_TABLET | Freq: Four times a day (QID) | ORAL | Status: DC | PRN

## 2013-03-22 NOTE — MAU Provider Note (Signed)
Attestation of Attending Supervision of Advanced Practitioner: Evaluation and management procedures were performed by the PA/NP/CNM/OB Fellow under my supervision/collaboration. Chart reviewed and agree with management and plan.  Selam Pietsch V 03/22/2013 7:03 AM

## 2013-03-25 ENCOUNTER — Encounter (HOSPITAL_COMMUNITY): Payer: Self-pay | Admitting: *Deleted

## 2013-03-25 ENCOUNTER — Inpatient Hospital Stay (HOSPITAL_COMMUNITY)
Admission: AD | Admit: 2013-03-25 | Discharge: 2013-03-25 | Disposition: A | Discharge status: Home / Self Care | Payer: Medicaid Other | Source: Ambulatory Visit | Attending: Obstetrics & Gynecology | Admitting: Obstetrics & Gynecology

## 2013-03-25 DIAGNOSIS — R109 Unspecified abdominal pain: Secondary | ICD-10-CM | POA: Insufficient documentation

## 2013-03-25 DIAGNOSIS — K429 Umbilical hernia without obstruction or gangrene: Secondary | ICD-10-CM | POA: Insufficient documentation

## 2013-03-25 DIAGNOSIS — O99891 Other specified diseases and conditions complicating pregnancy: Secondary | ICD-10-CM | POA: Insufficient documentation

## 2013-03-25 LAB — URINALYSIS, ROUTINE W REFLEX MICROSCOPIC
Bilirubin Urine: NEGATIVE
Glucose, UA: 100 mg/dL — AB
Ketones, ur: NEGATIVE mg/dL
Nitrite: NEGATIVE
Specific Gravity, Urine: 1.02 (ref 1.005–1.030)
pH: 7 (ref 5.0–8.0)

## 2013-03-25 LAB — URINE MICROSCOPIC-ADD ON

## 2013-03-25 MED ORDER — OXYCODONE-ACETAMINOPHEN 5-325 MG PO TABS
2.0000 | ORAL_TABLET | ORAL | Status: AC
  Administered 2013-03-25: 2 via ORAL
  Filled 2013-03-25: qty 2

## 2013-03-25 MED ORDER — OXYCODONE-ACETAMINOPHEN 5-325 MG PO TABS: 1.0000 | ORAL_TABLET | Freq: Four times a day (QID) | ORAL | Status: DC | PRN

## 2013-03-25 NOTE — MAU Note (Signed)
Pt. Here to follow up due to umbilical hernia. Had an appointment 03-13-13 and they told her if it came out more or became more uncomfortable to come to the hospital. Denies any leakage of fluid or bleeding. Pt. Was seen here 2 weeks ago and was contracting and was given procardia at this time. Not currently feeling any contractions or cramping at this time.

## 2013-03-25 NOTE — MAU Note (Signed)
Pt presents with complaints of abdominal pain and she does have a hernia. Denies any bleeding or LOF. States baby is active.

## 2013-03-25 NOTE — MAU Provider Note (Signed)
Chief Complaint:  Abdominal Pain   First Provider Initiated Contact with Patient 03/25/13 1835      HPI: Jade Hancock is a 27 y.o. [id number] at [redacted]w[redacted]d who presents to maternity admissions reporting increased pain and slight increase in size of her umbilical hernia. She has not had a change in bowel habits.  She reports good fetal movement, denies contractions/cramping, LOF, vaginal bleeding, vaginal itching/burning, urinary symptoms, h/a, dizziness, n/v, or fever/chills.    Pt reports she has Vicodin prescription but has not taken any recently.  She is concerned about taking pain medication in pregnancy.  She also reports that the Percocet she received in MAU in a previous visit worked better for her.    Past Medical History: Past Medical History  Diagnosis Date  . Strep throat   . Ovarian cyst   . Tubal pregnancy 2005  . Umbilical hernia   . Abnormal Pap smear     LEEP    Past obstetric history: OB History   Grav Para Term Preterm Abortions TAB SAB Ect Mult Living   7 4 4  2  1 1  4      # Outc Date GA Lbr Len/2nd Wgt Sex Del Anes PTL Lv   1 TRM 2004 [redacted]w[redacted]d  3.033kg(6lb11oz) F SVD EPI No Yes   2 ECT 2005           Comments: twin pregnancy; one implanted in R tube - resulted in R fallopian tube removal   3 TRM 2006 [redacted]w[redacted]d  2.778kg(6lb2oz) F SVD EPI No Yes   4 TRM 2007 [redacted]w[redacted]d  2.92kg(6lb7oz) M SVD EPI No Yes   5 TRM 2009 [redacted]w[redacted]d  2.608kg(5lb12oz) F SVD EPI No Yes   6 SAB 2012 [redacted]w[redacted]d          7 CUR               Past Surgical History: Past Surgical History  Procedure Laterality Date  . Appendectomy    . Laparoscopy for ectopic pregnancy      right tube removed  . Leep      Family History: Family History  Problem Relation Age of Onset  . Alcohol abuse Neg Hx   . Arthritis Neg Hx   . Birth defects Neg Hx   . Cancer Neg Hx   . Depression Neg Hx   . Drug abuse Neg Hx   . Early death Neg Hx   . Hearing loss Neg Hx   . Heart disease Neg Hx   . Hyperlipidemia Neg Hx   .  Kidney disease Neg Hx   . Learning disabilities Neg Hx   . Mental illness Neg Hx   . Mental retardation Neg Hx   . Miscarriages / Stillbirths Neg Hx   . Stroke Neg Hx   . Vision loss Neg Hx   . Hypertension Mother   . Diabetes Father   . Hypertension Father   . Asthma Father   . Asthma Paternal Aunt   . COPD Paternal Aunt   . Diabetes Paternal Aunt   . Hypertension Paternal Aunt   . Diabetes Paternal Grandmother   . Asthma Paternal Grandmother     Social History: History  Substance Use Topics  . Smoking status: Former Smoker -- 0.50 packs/day    Types: Cigarettes    Quit date: 10/13/2012  . Smokeless tobacco: Never Used     Comment: quit with preg  . Alcohol Use: No    Allergies: No Known Allergies  Meds:  Prescriptions prior to admission  Medication Sig Dispense Refill  . acetaminophen (TYLENOL) 325 MG tablet Take 650 mg by mouth every 6 (six) hours as needed for pain.      . pantoprazole (PROTONIX) 40 MG tablet Take 1 tablet (40 mg total) by mouth daily.  30 tablet  5  . Prenatal Vit-Fe Fumarate-FA (PRENATAL MULTIVITAMIN) TABS Take 1 tablet by mouth every morning.      . [DISCONTINUED] HYDROcodone-acetaminophen (NORCO/VICODIN) 5-325 MG per tablet Take 1 tablet by mouth every 6 (six) hours as needed for pain.  15 tablet  0    ROS: Pertinent findings in history of present illness.  Physical Exam  Blood pressure 108/67, pulse 70, temperature 97.8 F (36.6 C), temperature source Oral, resp. rate 16, height 5\' 8"  (1.727 m), weight 68.947 kg (152 lb), last menstrual period 10/03/2012. GENERAL: Well-developed, well-nourished female in no acute distress.  HEENT: normocephalic HEART: normal rate RESP: normal effort ABDOMEN: Soft, gravid appropriate for gestational age, bowel sounds active, no guarding, no rebound tenderness, umbilical hernia noted with 4-5 cm soft bulge not visible but palpable at 11'oclock r/t umbilicus, tenderness at and immediately around hernia, but no  other tenderness of abdomen noted  EXTREMITIES: Nontender, no edema NEURO: alert and oriented    FHT:  Baseline 145 , moderate variability, accelerations present, no decelerations Contractions: None on toco or to to palpation   Labs: Results for orders placed during the hospital encounter of 03/25/13 (from the past 24 hour(s))  URINALYSIS, ROUTINE W REFLEX MICROSCOPIC     Status: Abnormal   Collection Time    03/25/13  5:35 PM      Result Value Range   Color, Urine YELLOW  YELLOW   APPearance HAZY (*) CLEAR   Specific Gravity, Urine 1.020  1.005 - 1.030   pH 7.0  5.0 - 8.0   Glucose, UA 100 (*) NEGATIVE mg/dL   Hgb urine dipstick TRACE (*) NEGATIVE   Bilirubin Urine NEGATIVE  NEGATIVE   Ketones, ur NEGATIVE  NEGATIVE mg/dL   Protein, ur NEGATIVE  NEGATIVE mg/dL   Urobilinogen, UA 2.0 (*) 0.0 - 1.0 mg/dL   Nitrite NEGATIVE  NEGATIVE   Leukocytes, UA NEGATIVE  NEGATIVE  URINE MICROSCOPIC-ADD ON     Status: Abnormal   Collection Time    03/25/13  5:35 PM      Result Value Range   Squamous Epithelial / LPF FEW (*) RARE   RBC / HPF 3-6  <3 RBC/hpf   Bacteria, UA FEW (*) RARE   Urine-Other MUCOUS PRESENT      Assessment: 1. Umbilical hernia     Plan: Percocet 5/325 x2 tabs in MAU Consulted with Dr Erin Fulling Discharge home Reassurance provided that there are no signs of incarceration of bowel.  Discussed expected course of umbilical hernia in pregnancy and plan to reevaluate after pregnancy r/t treatment. Given warning signs r/t emergency, need to return to MAU. Percocet 5/325 x20 tabs prescribed PTL precautions and fetal kick counts F/U as schedule in clinic Return to MAU as needed  Follow-up Information   Follow up with [facility]. (Return to MAU as needed)    Contact information:   [address] [place] [address] [phone]       Medication List    STOP taking these medications       HYDROcodone-acetaminophen 5-325 MG per tablet   Commonly known as:  NORCO/VICODIN      TAKE these medications  acetaminophen 325 MG tablet  Commonly known as:  TYLENOL  Take 650 mg by mouth every 6 (six) hours as needed for pain.     oxyCODONE-acetaminophen 5-325 MG per tablet  Commonly known as:  PERCOCET/ROXICET  Take 1-2 tablets by mouth every 6 (six) hours as needed for pain.     pantoprazole 40 MG tablet  Commonly known as:  PROTONIX  Take 1 tablet (40 mg total) by mouth daily.     prenatal multivitamin Tabs tablet  Take 1 tablet by mouth every morning.        Sharen Counter Certified Nurse-Midwife 03/25/2013 7:03 PM

## 2013-03-27 NOTE — MAU Provider Note (Signed)
Attestation of Attending Supervision of Advanced Practitioner (CNM/NP): Evaluation and management procedures were performed by the Advanced Practitioner under my supervision and collaboration.  I have reviewed the Advanced Practitioner's note and chart, and I agree with the management and plan.  HARRAWAY-SMITH, Grettel Rames 11:37 AM     

## 2013-04-05 ENCOUNTER — Ambulatory Visit (INDEPENDENT_AMBULATORY_CARE_PROVIDER_SITE_OTHER): Payer: Self-pay | Admitting: Advanced Practice Midwife

## 2013-04-05 VITALS — BP 117/73 | Wt 151.8 lb

## 2013-04-05 DIAGNOSIS — O0930 Supervision of pregnancy with insufficient antenatal care, unspecified trimester: Secondary | ICD-10-CM

## 2013-04-05 DIAGNOSIS — O0932 Supervision of pregnancy with insufficient antenatal care, second trimester: Secondary | ICD-10-CM

## 2013-04-05 LAB — POCT URINALYSIS DIP (DEVICE)
Bilirubin Urine: NEGATIVE
Glucose, UA: NEGATIVE mg/dL
Hgb urine dipstick: NEGATIVE
Specific Gravity, Urine: 1.015 (ref 1.005–1.030)

## 2013-04-05 MED ORDER — OXYCODONE-ACETAMINOPHEN 5-325 MG PO TABS: 1.0000 | ORAL_TABLET | ORAL | Status: DC | PRN

## 2013-04-05 NOTE — Progress Notes (Signed)
Pulse: 86

## 2013-04-05 NOTE — Patient Instructions (Signed)
Excuse from Work, [employer], or Physical Activity Jade Hancock needs to be excused from: X        Work, please allow for light duty.  _____ School _____ Physical activity Beginning now and through the following date:until further notice  _____ He/she may return to work or school but still avoid physical activity from now until: ____________________ _____ He/she may return to full physical activity as of: ____________________ Caregiver's signature: Tawnya Crook   Date: 04/05/13 Document Released: 01/27/2001 Document Revised: 10/26/2011 Document Reviewed: 08/03/2005 ExitCare Patient Information 2014 [place], [place].

## 2013-04-05 NOTE — Progress Notes (Signed)
No complaints, feeling well. Might be interested in a water birth. Had a bad experience with an epidural (was stuck 4 times), and doesn't want to get an epidural if she can avoid it. Patient states that she just moved and she lost the rx for percocet. She states that she had about 14 left when she lost it. Will refill 14 only, but watch for excessive refill needs.

## 2013-04-19 ENCOUNTER — Ambulatory Visit (INDEPENDENT_AMBULATORY_CARE_PROVIDER_SITE_OTHER): Payer: Self-pay | Admitting: Family Medicine

## 2013-04-19 ENCOUNTER — Encounter: Payer: Self-pay | Admitting: Family Medicine

## 2013-04-19 VITALS — BP 109/69 | Temp 97.1°F | Wt 150.5 lb

## 2013-04-19 DIAGNOSIS — Z23 Encounter for immunization: Secondary | ICD-10-CM

## 2013-04-19 DIAGNOSIS — O0932 Supervision of pregnancy with insufficient antenatal care, second trimester: Secondary | ICD-10-CM

## 2013-04-19 DIAGNOSIS — K219 Gastro-esophageal reflux disease without esophagitis: Secondary | ICD-10-CM

## 2013-04-19 DIAGNOSIS — O99891 Other specified diseases and conditions complicating pregnancy: Secondary | ICD-10-CM

## 2013-04-19 DIAGNOSIS — O0930 Supervision of pregnancy with insufficient antenatal care, unspecified trimester: Secondary | ICD-10-CM

## 2013-04-19 LAB — POCT URINALYSIS DIP (DEVICE)
Bilirubin Urine: NEGATIVE
Ketones, ur: NEGATIVE mg/dL
pH: 7 (ref 5.0–8.0)

## 2013-04-19 MED ORDER — PANTOPRAZOLE SODIUM 40 MG PO TBEC: 40.0000 mg | DELAYED_RELEASE_TABLET | Freq: Every day | ORAL | Status: DC

## 2013-04-19 MED ORDER — OXYCODONE-ACETAMINOPHEN 5-325 MG PO TABS: 1.0000 | ORAL_TABLET | ORAL | Status: DC | PRN

## 2013-04-19 MED ORDER — TETANUS-DIPHTH-ACELL PERTUSSIS 5-2.5-18.5 LF-MCG/0.5 IM SUSP
0.5000 mL | Freq: Once | INTRAMUSCULAR | Status: AC
  Administered 2013-04-19: 0.5 mL via INTRAMUSCULAR

## 2013-04-19 MED ORDER — NIFEDIPINE 20 MG PO CAPS: 20.0000 mg | ORAL_CAPSULE | Freq: Three times a day (TID) | ORAL | Status: DC

## 2013-04-19 NOTE — Progress Notes (Signed)
  Subjective:    Jade Hancock is a 27 y.o. female being seen today for her obstetrical visit. She is at [redacted]w[redacted]d gestation. Patient reports backache, no bleeding, no leaking and occasional contractions. Fetal movement: normal.  Menstrual History: OB History   Grav Para Term Preterm Abortions TAB SAB Ect Mult Living   7 4 4  2  1 1  4        Patient's last menstrual period was 10/03/2012.    The following portions of the patient's history were reviewed and updated as appropriate: allergies, current medications, past family history, past medical history, past social history, past surgical history and problem list.  Review of Systems Pertinent items are noted in HPI.   Objective:    BP 109/69  Temp(Src) 97.1 F (36.2 C)  Wt 68.266 kg (150 lb 8 oz)  BMI 22.89 kg/m2  LMP 10/03/2012 FHT:  142 BPM  Uterine Size: 28 cm and size equals dates  Presentation:  Cervix closed/25/-3     Assessment:  Jade Hancock is a 27 y.o. [id number] at [redacted]w[redacted]d for ROB Plan:   Jade Hancock is a 27 y.o. [id number] at [redacted]w[redacted]d * here for ROB visit.  Complaints of Ctx when ambulating. Procardia 20mg  PRN. No cervical change.  Back pain. Severe pain. Pt has used percocet will give additional 15Tabs  Discussed with Patient:  -Plans to breast feed.  All questions answered. -Continue prenatal vitamins. - Reviewed genetics screen (Quad screen / first trimester screen / serum integrated screen / full integrated screen done/ not done).   -Reviewed fetal kick counts (Pt to perform daily at a time when the baby is active, lie laterally with both hands on belly in quiet room and count all movements (hiccups, shoulder rolls, obvious kicks, etc); pt is to report to clinic or MAU for less than 10 movements felt in a one hour time period-pt told as soon as she counts 10 movements the count is complete.)  - Routine precautions discussed (depression, infection s/s).   Patient provided with all pertinent phone numbers for  emergencies. - RTC for any VB, regular, painful cramps/ctxs occurring at a rate of >2/10 min, fever (100.5 or higher), n/v/d, any pain that is unresolving or worsening, LOF, decreased fetal movement, CP, SOB, edema  Problems: Patient Active Problem List   Diagnosis Date Noted  . Late prenatal care complicating pregnancy in second trimester 03/08/2013  . Umbilical hernia 03/08/2013  . Gastroesophageal reflux in pregancy in second trimester 03/08/2013  . Vaginal bleeding in pregnancy 03/08/2013  . History of loop electrical excision procedure (LEEP) 03/08/2013    To Do: 1. Glucose tolerance test ordered.  Patient will draw in clinic.  Will f/u test and amend plan based on results. 2. CBC and antibody screen ordered. 3. Rhogam given (if Rh-)  [ ]  Vaccines: Flu:  Tdap:  [ ]  BCM:   Edu: [ ]  PTL precautions; [ ]  BF class; [ ]  childbirth class; [ ]   BF counseling;

## 2013-04-19 NOTE — Addendum Note (Signed)
Addended by: Faythe Casa on: 04/19/2013 12:08 PM   Modules accepted: Orders

## 2013-04-19 NOTE — Patient Instructions (Addendum)
Pregnancy - Second Trimester The second trimester of pregnancy (3 to 6 months) is a period of rapid growth for you and your baby. At the end of the sixth month, your baby is about 9 inches long and weighs 1 1/2 pounds. You will begin to feel the baby move between 18 and 20 weeks of the pregnancy. This is called quickening. Weight gain is faster. A clear fluid (colostrum) may leak out of your breasts. You may feel small contractions of the womb (uterus). This is known as false labor or Braxton-Hicks contractions. This is like a practice for labor when the baby is ready to be born. Usually, the problems with morning sickness have usually passed by the end of your first trimester. Some women develop small dark blotches (called cholasma, mask of pregnancy) on their face that usually goes away after the baby is born. Exposure to the sun makes the blotches worse. Acne may also develop in some pregnant women and pregnant women who have acne, may find that it goes away. PRENATAL EXAMS  Blood work may continue to be done during prenatal exams. These tests are done to check on your health and the probable health of your baby. Blood work is used to follow your blood levels (hemoglobin). Anemia (low hemoglobin) is common during pregnancy. Iron and vitamins are given to help prevent this. You will also be checked for diabetes between 24 and 28 weeks of the pregnancy. Some of the previous blood tests may be repeated.  The size of the uterus is measured during each visit. This is to make sure that the baby is continuing to grow properly according to the dates of the pregnancy.  Your blood pressure is checked every prenatal visit. This is to make sure you are not getting toxemia.  Your urine is checked to make sure you do not have an infection, diabetes or protein in the urine.  Your weight is checked often to make sure gains are happening at the suggested rate. This is to ensure that both you and your baby are  growing normally.  Sometimes, an ultrasound is performed to confirm the proper growth and development of the baby. This is a test which bounces harmless sound waves off the baby so your caregiver can more accurately determine due dates. Sometimes, a test is done on the amniotic fluid surrounding the baby. This test is called an amniocentesis. The amniotic fluid is obtained by sticking a needle into the belly (abdomen). This is done to check the chromosomes in instances where there is a concern about possible genetic problems with the baby. It is also sometimes done near the end of pregnancy if an early delivery is required. In this case, it is done to help make sure the baby's lungs are mature enough for the baby to live outside of the womb. CHANGES OCCURING IN THE SECOND TRIMESTER OF PREGNANCY Your body goes through many changes during pregnancy. They vary from person to person. Talk to your caregiver about changes you notice that you are concerned about.  During the second trimester, you will likely have an increase in your appetite. It is normal to have cravings for certain foods. This varies from person to person and pregnancy to pregnancy.  Your lower abdomen will begin to bulge.  You may have to urinate more often because the uterus and baby are pressing on your bladder. It is also common to get more bladder infections during pregnancy. You can help this by drinking lots of fluids   and emptying your bladder before and after intercourse.  You may begin to get stretch marks on your hips, abdomen, and breasts. These are normal changes in the body during pregnancy. There are no exercises or medicines to take that prevent this change.  You may begin to develop swollen and bulging veins (varicose veins) in your legs. Wearing support hose, elevating your feet for 15 minutes, 3 to 4 times a day and limiting salt in your diet helps lessen the problem.  Heartburn may develop as the uterus grows and  pushes up against the stomach. Antacids recommended by your caregiver helps with this problem. Also, eating smaller meals 4 to 5 times a day helps.  Constipation can be treated with a stool softener or adding bulk to your diet. Drinking lots of fluids, and eating vegetables, fruits, and whole grains are helpful.  Exercising is also helpful. If you have been very active up until your pregnancy, most of these activities can be continued during your pregnancy. If you have been less active, it is helpful to start an exercise program such as walking.  Hemorrhoids may develop at the end of the second trimester. Warm sitz baths and hemorrhoid cream recommended by your caregiver helps hemorrhoid problems.  Backaches may develop during this time of your pregnancy. Avoid heavy lifting, wear low heal shoes, and practice good posture to help with backache problems.  Some pregnant women develop tingling and numbness of their hand and fingers because of swelling and tightening of ligaments in the wrist (carpel tunnel syndrome). This goes away after the baby is born.  As your breasts enlarge, you may have to get a bigger bra. Get a comfortable, cotton, support bra. Do not get a nursing bra until the last month of the pregnancy if you will be nursing the baby.  You may get a dark line from your belly button to the pubic area called the linea nigra.  You may develop rosy cheeks because of increase blood flow to the face.  You may develop spider looking lines of the face, neck, arms, and chest. These go away after the baby is born. HOME CARE INSTRUCTIONS   It is extremely important to avoid all smoking, herbs, alcohol, and unprescribed drugs during your pregnancy. These chemicals affect the formation and growth of the baby. Avoid these chemicals throughout the pregnancy to ensure the delivery of a healthy infant.  Most of your home care instructions are the same as suggested for the first trimester of your  pregnancy. Keep your caregiver's appointments. Follow your caregiver's instructions regarding medicine use, exercise, and diet.  During pregnancy, you are providing food for you and your baby. Continue to eat regular, well-balanced meals. Choose foods such as meat, fish, milk and other low fat dairy products, vegetables, fruits, and whole-grain breads and cereals. Your caregiver will tell you of the ideal weight gain.  A physical sexual relationship may be continued up until near the end of pregnancy if there are no other problems. Problems could include early (premature) leaking of amniotic fluid from the membranes, vaginal bleeding, abdominal pain, or other medical or pregnancy problems.  Exercise regularly if there are no restrictions. Check with your caregiver if you are unsure of the safety of some of your exercises. The greatest weight gain will occur in the last 2 trimesters of pregnancy. Exercise will help you:  Control your weight.  Get you in shape for labor and delivery.  Lose weight after you have the baby.  Wear   a good support or jogging bra for breast tenderness during pregnancy. This may help if worn during sleep. Pads or tissues may be used in the bra if you are leaking colostrum.  Do not use hot tubs, steam rooms or saunas throughout the pregnancy.  Wear your seat belt at all times when driving. This protects you and your baby if you are in an accident.  Avoid raw meat, uncooked cheese, cat litter boxes, and soil used by cats. These carry germs that can cause birth defects in the baby.  The second trimester is also a good time to visit your dentist for your dental health if this has not been done yet. Getting your teeth cleaned is okay. Use a soft toothbrush. Brush gently during pregnancy.  It is easier to leak urine during pregnancy. Tightening up and strengthening the pelvic muscles will help with this problem. Practice stopping your urination while you are going to the  bathroom. These are the same muscles you need to strengthen. It is also the muscles you would use as if you were trying to stop from passing gas. You can practice tightening these muscles up 10 times a set and repeating this about 3 times per day. Once you know what muscles to tighten up, do not perform these exercises during urination. It is more likely to contribute to an infection by backing up the urine.  Ask for help if you have financial, counseling, or nutritional needs during pregnancy. Your caregiver will be able to offer counseling for these needs as well as refer you for other special needs.  Your skin may become oily. If so, wash your face with mild soap, use non-greasy moisturizer and oil or cream based makeup. MEDICINES AND DRUG USE IN PREGNANCY  Take prenatal vitamins as directed. The vitamin should contain 1 milligram of folic acid. Keep all vitamins out of reach of children. Only a couple vitamins or tablets containing iron may be fatal to a baby or young child when ingested.  Avoid use of all medicines, including herbs, over-the-counter medicines, not prescribed or suggested by your caregiver. Only take over-the-counter or prescription medicines for pain, discomfort, or fever as directed by your caregiver. Do not use aspirin.  Let your caregiver also know about herbs you may be using.  Alcohol is related to a number of birth defects. This includes fetal alcohol syndrome. All alcohol, in any form, should be avoided completely. Smoking will cause low birth rate and premature babies.  Street or illegal drugs are very harmful to the baby. They are absolutely forbidden. A baby born to an addicted mother will be addicted at birth. The baby will go through the same withdrawal an adult does. SEEK MEDICAL CARE IF:  You have any concerns or worries during your pregnancy. It is better to call with your questions if you feel they cannot wait, rather than worry about them. SEEK IMMEDIATE  MEDICAL CARE IF:   An unexplained oral temperature above 102 F (38.9 C) develops, or as your caregiver suggests.  You have leaking of fluid from the vagina (birth canal). If leaking membranes are suspected, take your temperature and tell your caregiver of this when you call.  There is vaginal spotting, bleeding, or passing clots. Tell your caregiver of the amount and how many pads are used. Light spotting in pregnancy is common, especially following intercourse.  You develop a bad smelling vaginal discharge with a change in the color from clear to white.  You continue to feel   sick to your stomach (nauseated) and have no relief from remedies suggested. You vomit blood or coffee ground-like materials.  You lose more than 2 pounds of weight or gain more than 2 pounds of weight over 1 week, or as suggested by your caregiver.  You notice swelling of your face, hands, feet, or legs.  You get exposed to German measles and have never had them.  You are exposed to fifth disease or chickenpox.  You develop belly (abdominal) pain. Round ligament discomfort is a common non-cancerous (benign) cause of abdominal pain in pregnancy. Your caregiver still must evaluate you.  You develop a bad headache that does not go away.  You develop fever, diarrhea, pain with urination, or shortness of breath.  You develop visual problems, blurry, or double vision.  You fall or are in a car accident or any kind of trauma.  There is mental or physical violence at home. Document Released: 07/28/2001 Document Revised: 04/27/2012 Document Reviewed: 01/30/2009 [employer] Patient Information 2014 [employer].  

## 2013-04-19 NOTE — Progress Notes (Signed)
P= 80,   C/o pain in groin/ upper legs that started a few days ago - worse at night=8. C/o contractions about every other day.

## 2013-05-03 ENCOUNTER — Encounter: Payer: Self-pay | Admitting: Obstetrics and Gynecology

## 2013-05-03 ENCOUNTER — Encounter: Payer: Self-pay | Admitting: *Deleted

## 2013-05-10 ENCOUNTER — Ambulatory Visit (INDEPENDENT_AMBULATORY_CARE_PROVIDER_SITE_OTHER): Payer: Self-pay | Admitting: Family

## 2013-05-10 ENCOUNTER — Encounter (HOSPITAL_COMMUNITY): Payer: Self-pay | Admitting: *Deleted

## 2013-05-10 ENCOUNTER — Inpatient Hospital Stay (HOSPITAL_COMMUNITY)
Admission: AD | Admit: 2013-05-10 | Discharge: 2013-05-10 | Disposition: A | Discharge status: Home / Self Care | Payer: Self-pay | Source: Ambulatory Visit | Attending: Obstetrics & Gynecology | Admitting: Obstetrics & Gynecology

## 2013-05-10 ENCOUNTER — Encounter: Payer: Self-pay | Admitting: Family

## 2013-05-10 VITALS — BP 108/72 | Wt 151.9 lb

## 2013-05-10 DIAGNOSIS — O9989 Other specified diseases and conditions complicating pregnancy, childbirth and the puerperium: Secondary | ICD-10-CM

## 2013-05-10 DIAGNOSIS — O4700 False labor before 37 completed weeks of gestation, unspecified trimester: Secondary | ICD-10-CM | POA: Insufficient documentation

## 2013-05-10 DIAGNOSIS — Z3490 Encounter for supervision of normal pregnancy, unspecified, unspecified trimester: Secondary | ICD-10-CM

## 2013-05-10 DIAGNOSIS — O0930 Supervision of pregnancy with insufficient antenatal care, unspecified trimester: Secondary | ICD-10-CM

## 2013-05-10 DIAGNOSIS — O0932 Supervision of pregnancy with insufficient antenatal care, second trimester: Secondary | ICD-10-CM

## 2013-05-10 DIAGNOSIS — O4690 Antepartum hemorrhage, unspecified, unspecified trimester: Secondary | ICD-10-CM

## 2013-05-10 LAB — POCT URINALYSIS DIP (DEVICE)
Ketones, ur: NEGATIVE mg/dL
Protein, ur: NEGATIVE mg/dL
Specific Gravity, Urine: 1.015 (ref 1.005–1.030)

## 2013-05-10 MED ORDER — NIFEDIPINE 10 MG PO CAPS
20.0000 mg | ORAL_CAPSULE | Freq: Once | ORAL | Status: AC
  Administered 2013-05-10: 20 mg via ORAL
  Filled 2013-05-10: qty 2

## 2013-05-10 MED ORDER — OXYCODONE-ACETAMINOPHEN 5-325 MG PO TABS
1.0000 | ORAL_TABLET | Freq: Once | ORAL | Status: AC
  Administered 2013-05-10: 1 via ORAL
  Filled 2013-05-10: qty 1

## 2013-05-10 MED ORDER — OXYCODONE-ACETAMINOPHEN 5-325 MG PO TABS: 1.0000 | ORAL_TABLET | ORAL | Status: DC | PRN

## 2013-05-10 MED ORDER — NIFEDIPINE 20 MG PO CAPS: 20.0000 mg | ORAL_CAPSULE | Freq: Three times a day (TID) | ORAL | Status: DC

## 2013-05-10 NOTE — MAU Provider Note (Signed)
History     CSN: [id number]  Arrival date and time: 05/10/13 1207   First Provider Initiated Contact with Patient 05/10/13 1250      Chief Complaint  Patient presents with  . Contractions   HPI Comments: Jade Hancock is a 27 y.o. [id number] at [redacted]w[redacted]d who presents because of contractions. Her contractions started in August but became more painful on Monday. She took procardia a few weeks ago but has not been able to take it because she moved and was not able to obtain her prescription from her old pharmacy. She also takes Percocet for pain from contractions. Her last dose was on Friday. She has a history of subchorionic hemorrhage at beginning of pregnancy and umbilical hernia. She reports good fetal movement, no vaginal bleeding or discharge. Bloody show 4 days ago. No leaking of fluid. She has symptoms of nausea with no vomiting. No constipation or diarrhea.   OB History   Grav Para Term Preterm Abortions TAB SAB Ect Mult Living   7 4 4  2  1 1  4       Past Medical History  Diagnosis Date  . Strep throat   . Ovarian cyst   . Tubal pregnancy 2005  . Umbilical hernia   . Abnormal Pap smear     LEEP    Past Surgical History  Procedure Laterality Date  . Appendectomy    . Laparoscopy for ectopic pregnancy      right tube removed  . Leep      Family History  Problem Relation Age of Onset  . Alcohol abuse Neg Hx   . Arthritis Neg Hx   . Birth defects Neg Hx   . Cancer Neg Hx   . Depression Neg Hx   . Drug abuse Neg Hx   . Early death Neg Hx   . Hearing loss Neg Hx   . Heart disease Neg Hx   . Hyperlipidemia Neg Hx   . Kidney disease Neg Hx   . Learning disabilities Neg Hx   . Mental illness Neg Hx   . Mental retardation Neg Hx   . Miscarriages / Stillbirths Neg Hx   . Stroke Neg Hx   . Vision loss Neg Hx   . Hypertension Mother   . Diabetes Father   . Hypertension Father   . Asthma Father   . Asthma Paternal Aunt   . COPD Paternal Aunt   . Diabetes  Paternal Aunt   . Hypertension Paternal Aunt   . Diabetes Paternal Grandmother   . Asthma Paternal Grandmother     History  Substance Use Topics  . Smoking status: Former Smoker -- 0.50 packs/day    Types: Cigarettes    Quit date: 10/13/2012  . Smokeless tobacco: Never Used     Comment: quit with preg  . Alcohol Use: No    Allergies: No Known Allergies  Prescriptions prior to admission  Medication Sig Dispense Refill  . acetaminophen (TYLENOL) 325 MG tablet Take 650 mg by mouth every 6 (six) hours as needed for pain.      Marland Kitchen NIFEdipine (PROCARDIA) 20 MG capsule Take 1 capsule (20 mg total) by mouth 3 (three) times daily.  20 capsule  0  . oxyCODONE-acetaminophen (PERCOCET/ROXICET) 5-325 MG per tablet Take 1 tablet by mouth every 4 (four) hours as needed for pain.  15 tablet  0  . pantoprazole (PROTONIX) 40 MG tablet Take 1 tablet (40 mg total) by mouth daily.  30 tablet  5  . Prenatal Vit-Fe Fumarate-FA (PRENATAL MULTIVITAMIN) TABS Take 1 tablet by mouth every morning.        Review of Systems  Constitutional: Negative for fever.  Gastrointestinal: Positive for nausea. Negative for vomiting, diarrhea and constipation.  Genitourinary: Negative for dysuria.  Neurological: Negative for headaches.   Physical Exam   Blood pressure 116/66, pulse 71, temperature 98.1 F (36.7 C), temperature source Oral, resp. rate 16, last menstrual period 10/03/2012, SpO2 99.00%.  Physical Exam  Constitutional: She is oriented to person, place, and time. She appears well-developed and well-nourished. No distress.  Cardiovascular: Normal rate, regular rhythm and normal heart sounds.   Respiratory: Effort normal and breath sounds normal. No respiratory distress. She has no wheezes. She has no rales.  GI: Soft. Bowel sounds are normal. There is tenderness. There is no rebound and no guarding.  Left inguinal tenderness  Musculoskeletal: Normal range of motion. She exhibits no edema and no  tenderness.  Neurological: She is alert and oriented to person, place, and time.  Skin: Skin is warm and dry. She is not diaphoretic. No erythema.   FHT: 135bpm, moderate variability, +accels, no decels, Category I tracing UC: regularly every 3-4 minutes,   Dilation: Fingertip Effacement (%): Thick Cervical Position: Posterior Exam by:: Edeline Greening, CNM   MAU Course  Procedures  MDM - given procardia 20mg  which spaced contractions - given percocet x1 for pain since she has been getting it from clinic  Urinalysis    Component Value Date/Time   LABSPEC 1.015 05/10/2013 1150   PHURINE 8.5* 05/10/2013 1150   GLUCOSEU NEGATIVE 05/10/2013 1150   HGBUR NEGATIVE 05/10/2013 1150   BILIRUBINUR NEGATIVE 05/10/2013 1150   KETONESUR NEGATIVE 05/10/2013 1150   PROTEINUR NEGATIVE 05/10/2013 1150   UROBILINOGEN 1.0 05/10/2013 1150   NITRITE NEGATIVE 05/10/2013 1150   LEUKOCYTESUR NEGATIVE 05/10/2013 1150    Assessment and Plan  Jade Hancock is a 27 y.o. Z6X0960 at [redacted]w[redacted]d who presents with premature contractions.  #Premature contractions - she has had this problem prior and was prescribed Procardia, which helped while she was taking it. - continue Procardia 20mg  TID - given labor precautions - follow-up at regular scheduled appointment for prenatal care - Discharge home   Jade Hancock 05/10/2013, 12:55 PM   Evaluation and management procedures were performed by Resident physician under my supervision/collaboration. Chart reviewed, patient examined by me and I agree with management and plan. Chronic PT irreg UCs without cx change. If still symptomatic on the Procardia will return >24 hrs for fFN and consideration for BMZ if indications.

## 2013-05-10 NOTE — Addendum Note (Signed)
Addended by: Adam Phenix on: 05/10/2013 04:55 PM   Modules accepted: Orders

## 2013-05-10 NOTE — Progress Notes (Signed)
Reports having contractions and seen at [facility], given terbutaline.  Cervix closed yesterday. Reports that she feels contractions are getting stronger.  No report of vaginal bleeding or leaking of fluid.  Appears to have contractions q 5 min; To MAU for eval.

## 2013-05-10 NOTE — MAU Note (Signed)
Was at [facility] yesterday, was given something for preterm contractions, was closed.  Was at clinic this morning.  Is contracting was 1 cm.  Has lost mucous plug.

## 2013-05-10 NOTE — Progress Notes (Signed)
Pulse: 85 Went to [facility] yesterday with contractions was given medicine to help her stop contracting.

## 2013-05-11 ENCOUNTER — Encounter: Payer: Self-pay | Admitting: *Deleted

## 2013-05-11 ENCOUNTER — Ambulatory Visit (INDEPENDENT_AMBULATORY_CARE_PROVIDER_SITE_OTHER): Payer: Self-pay | Admitting: Advanced Practice Midwife

## 2013-05-11 VITALS — BP 107/69 | Temp 97.5°F | Wt 151.0 lb

## 2013-05-11 DIAGNOSIS — M549 Dorsalgia, unspecified: Secondary | ICD-10-CM

## 2013-05-11 DIAGNOSIS — F1910 Other psychoactive substance abuse, uncomplicated: Secondary | ICD-10-CM

## 2013-05-11 DIAGNOSIS — O9989 Other specified diseases and conditions complicating pregnancy, childbirth and the puerperium: Secondary | ICD-10-CM

## 2013-05-11 DIAGNOSIS — Z765 Malingerer [conscious simulation]: Secondary | ICD-10-CM

## 2013-05-11 LAB — FETAL FIBRONECTIN: Fetal Fibronectin: NEGATIVE

## 2013-05-11 NOTE — Progress Notes (Signed)
Pt to clinic today for FFN.  Seen at [facility] and MAU yesterday with cervical exam.  Continues to report constant low-back and pelvic pain.  No cervical exam, sexual intercourse, or anything per vagina x24 hours.  FFN collected.  Cervix unchanged from yesterday's exam.

## 2013-05-11 NOTE — Progress Notes (Signed)
Pulse  75   C/o of constant lower back pain, and pelvic pain.

## 2013-05-12 LAB — ALCOHOL METABOLITE (ETG), URINE: Ethyl Glucuronide (EtG): NEGATIVE ng/mL

## 2013-05-16 LAB — OXYCODONE, URINE (LC/MS-MS)
Oxycodone, ur: 143 ng/mL — AB
Oxymorphone: NEGATIVE ng/mL

## 2013-05-17 LAB — PRESCRIPTION MONITORING PROFILE (19 PANEL)
Amphetamine/Meth: NEGATIVE ng/mL
Buprenorphine, Urine: NEGATIVE ng/mL
Carisoprodol, Urine: NEGATIVE ng/mL
Methadone Screen, Urine: NEGATIVE ng/mL
Methaqualone: NEGATIVE ng/mL
Nitrites, Initial: NEGATIVE ug/mL
Propoxyphene: NEGATIVE ng/mL
Tapentadol, urine: NEGATIVE ng/mL
Tramadol Scrn, Ur: NEGATIVE ng/mL

## 2013-05-24 ENCOUNTER — Encounter: Payer: Self-pay | Admitting: Obstetrics and Gynecology

## 2013-05-26 ENCOUNTER — Encounter: Payer: Self-pay | Admitting: *Deleted

## 2013-05-29 ENCOUNTER — Encounter: Payer: Self-pay | Admitting: Obstetrics and Gynecology

## 2013-06-14 ENCOUNTER — Inpatient Hospital Stay (HOSPITAL_COMMUNITY)
Admission: AD | Admit: 2013-06-14 | Discharge: 2013-06-14 | Disposition: A | Discharge status: Home / Self Care | Payer: Self-pay | Source: Ambulatory Visit | Attending: Obstetrics and Gynecology | Admitting: Obstetrics and Gynecology

## 2013-06-14 ENCOUNTER — Encounter (HOSPITAL_COMMUNITY): Payer: Self-pay

## 2013-06-14 DIAGNOSIS — R109 Unspecified abdominal pain: Secondary | ICD-10-CM | POA: Insufficient documentation

## 2013-06-14 DIAGNOSIS — K429 Umbilical hernia without obstruction or gangrene: Secondary | ICD-10-CM | POA: Insufficient documentation

## 2013-06-14 DIAGNOSIS — O479 False labor, unspecified: Secondary | ICD-10-CM

## 2013-06-14 DIAGNOSIS — O4700 False labor before 37 completed weeks of gestation, unspecified trimester: Secondary | ICD-10-CM | POA: Insufficient documentation

## 2013-06-14 LAB — RAPID URINE DRUG SCREEN, HOSP PERFORMED
Amphetamines: NOT DETECTED
Barbiturates: NOT DETECTED
Tetrahydrocannabinol: NOT DETECTED

## 2013-06-14 LAB — URINALYSIS, ROUTINE W REFLEX MICROSCOPIC
Bilirubin Urine: NEGATIVE
Hgb urine dipstick: NEGATIVE
Ketones, ur: NEGATIVE mg/dL
Protein, ur: NEGATIVE mg/dL
Urobilinogen, UA: 0.2 mg/dL (ref 0.0–1.0)

## 2013-06-14 MED ORDER — RANITIDINE HCL 150 MG PO TABS: 150.0000 mg | ORAL_TABLET | Freq: Two times a day (BID) | ORAL | Status: DC

## 2013-06-14 MED ORDER — OXYCODONE-ACETAMINOPHEN 5-325 MG PO TABS
1.0000 | ORAL_TABLET | Freq: Once | ORAL | Status: AC
  Administered 2013-06-14: 1 via ORAL
  Filled 2013-06-14: qty 1

## 2013-06-14 MED ORDER — OXYCODONE-ACETAMINOPHEN 5-325 MG PO TABS: 1.0000 | ORAL_TABLET | Freq: Two times a day (BID) | ORAL | Status: DC | PRN

## 2013-06-14 NOTE — MAU Provider Note (Signed)
Chief Complaint:  Labor Eval   Jade Hancock is a 27 y.o.  [phone] with IUP at [redacted]w[redacted]d presenting for Labor Eval  Pt states that she has had a long standing hx of abdominal pain throughout this pregnancy that has been attributed to an umbilical hernia and round ligament pain.  Beginning last night though, noticed she was having contractions every 4 minutes. She denies any LOF or VB. +FM.  States has missed the last few appointments as she was waiting for the women's clinic to call her back. Last seen at the end of September.    PNC at [facility] and in the MAU. Has a hx in review of the chart of chronic abd pain this pregnancy and has been treated with percocet on and off.  Has a referral for general surgery to look at her hernia postpartum but is waiting for medicaid ito come through n order to make the appt.  Pregnancy has been uncomplicated.     No fevers, chills, nausea, vomiting, cp, sob, LEE. Having significant heartburn.   Menstrual History: OB History   Grav Para Term Preterm Abortions TAB SAB Ect Mult Living   7 4 4  2  1 1  4      G1- term, NSVD 6lb G2- ectopic s/p laparascopic right salpingectomy G3- term, NSVD G4- term, NSVD G5 - term NSVD G6- SAB G7 - current  Patient's last menstrual period was 10/03/2012.      Past Medical History  Diagnosis Date  . Strep throat   . Ovarian cyst   . Tubal pregnancy 2005  . Umbilical hernia   . Abnormal Pap smear     LEEP    Past Surgical History  Procedure Laterality Date  . Appendectomy    . Laparoscopy for ectopic pregnancy      right tube removed  . Leep      Family History  Problem Relation Age of Onset  . Alcohol abuse Neg Hx   . Arthritis Neg Hx   . Birth defects Neg Hx   . Cancer Neg Hx   . Depression Neg Hx   . Drug abuse Neg Hx   . Early death Neg Hx   . Hearing loss Neg Hx   . Heart disease Neg Hx   . Hyperlipidemia Neg Hx   . Kidney disease Neg Hx   . Learning disabilities Neg Hx   . Mental illness Neg Hx    . Mental retardation Neg Hx   . Miscarriages / Stillbirths Neg Hx   . Stroke Neg Hx   . Vision loss Neg Hx   . Hypertension Mother   . Diabetes Father   . Hypertension Father   . Asthma Father   . Asthma Paternal Aunt   . COPD Paternal Aunt   . Diabetes Paternal Aunt   . Hypertension Paternal Aunt   . Diabetes Paternal Grandmother   . Asthma Paternal Grandmother     History  Substance Use Topics  . Smoking status: Former Smoker -- 0.50 packs/day    Types: Cigarettes    Quit date: 10/13/2012  . Smokeless tobacco: Never Used     Comment: quit with preg  . Alcohol Use: No     No Known Allergies  Prescriptions prior to admission  Medication Sig Dispense Refill  . acetaminophen (TYLENOL) 500 MG tablet Take 1,000 mg by mouth every 6 (six) hours as needed for pain.      . calcium carbonate (TUMS - DOSED IN  MG ELEMENTAL CALCIUM) 500 MG chewable tablet Chew 2 tablets by mouth daily.      . Prenatal Vit-Fe Fumarate-FA (PRENATAL MULTIVITAMIN) TABS Take 1 tablet by mouth every morning.        Review of Systems - Negative except for what is mentioned in HPI.  Physical Exam  Blood pressure 120/71, pulse 93, temperature 98.1 F (36.7 C), temperature source Oral, resp. rate 18, last menstrual period 10/03/2012, SpO2 97.00%. GENERAL: Well-developed, well-nourished female in no acute distress.  LUNGS: Clear to auscultation bilaterally.  HEART: Regular rate and rhythm. ABDOMEN: Soft, nontender, nondistended, gravid. Palpable, nonreducible umbilical hernia without evidence of incarceration.  EXTREMITIES: Nontender, no edema, 2+ distal pulses. Cervical Exam: Dilatation 1cm   Effacement 50%   Station -3 by nurse check   FHT:  Baseline rate 125 bpm   Variability moderate  Accelerations present   Decelerations none Contractions: irregular, every 5-7 min  Dilation: 1 Effacement (%): 50 Cervical Position: Posterior Station: -3 Presentation: Vertex Exam by:: Peace, rn  Labs: Results  for orders placed during the hospital encounter of 06/14/13 (from the past 24 hour(s))  URINALYSIS, ROUTINE W REFLEX MICROSCOPIC   Collection Time    06/14/13 10:15 AM      Result Value Range   Color, Urine YELLOW  YELLOW   APPearance CLEAR  CLEAR   Specific Gravity, Urine 1.020  1.005 - 1.030   pH 6.0  5.0 - 8.0   Glucose, UA NEGATIVE  NEGATIVE mg/dL   Hgb urine dipstick NEGATIVE  NEGATIVE   Bilirubin Urine NEGATIVE  NEGATIVE   Ketones, ur NEGATIVE  NEGATIVE mg/dL   Protein, ur NEGATIVE  NEGATIVE mg/dL   Urobilinogen, UA 0.2  0.0 - 1.0 mg/dL   Nitrite NEGATIVE  NEGATIVE   Leukocytes, UA NEGATIVE  NEGATIVE    Imaging Studies:  No results found.  Assessment: Jade Hancock is  27 y.o. [phone] at [redacted]w[redacted]d presents with Labor Eval .  Plan: 1) labor eval - contractions spaced out - cervix unchanged by nurse exam   2) abdominal pain - given percocet x 1 here and markedly improved - rx given for #12 percocet to get through to her appt next week - discussed that pain is likely a combination of things including round ligament, umbilical hernia and braxton-hicks  3) lapse in [facility] - husband made appt for wife next week Wednesday - urged to keep the appt  - pt quite upset that no one told her about drug testing at last visit until she saw the result on mychart. Discussed that any pt who gets narcotics from our practice will have random drug tests. Once explained, pt was understanding.   4) FWB - cat I tracing - GBS collected and sent.   Talishia Betzler L 10/29/201411:47 AM

## 2013-06-14 NOTE — MAU Note (Signed)
Patient is in with c/o contractions q13mins since last night. She denies vaginal bleeding or LOF. She reports good fetal movement. She states that she have not had an OB appt for 3 weeks. She states that she is awaiting a call back from the [facility]- clinic.

## 2013-06-14 NOTE — MAU Note (Signed)
Contracting every 4 min. No bleeding or leaking.

## 2013-06-16 NOTE — MAU Provider Note (Signed)
Attestation of Attending Supervision of Advanced Practitioner (CNM/NP): Evaluation and management procedures were performed by the Advanced Practitioner under my supervision and collaboration.  I have reviewed the Advanced Practitioner's note and chart, and I agree with the management and plan.  Priscilla Kirstein 06/16/2013 9:26 AM

## 2013-06-17 LAB — CULTURE, BETA STREP (GROUP B ONLY)

## 2013-06-21 ENCOUNTER — Encounter: Payer: Self-pay | Admitting: Family Medicine

## 2013-06-22 ENCOUNTER — Other Ambulatory Visit: Payer: Self-pay

## 2013-06-23 ENCOUNTER — Inpatient Hospital Stay (HOSPITAL_COMMUNITY)
Admission: AD | Admit: 2013-06-23 | Discharge: 2013-06-23 | Disposition: A | Discharge status: Home / Self Care | Payer: Self-pay | Source: Ambulatory Visit | Attending: Family Medicine | Admitting: Family Medicine

## 2013-06-23 ENCOUNTER — Encounter (HOSPITAL_COMMUNITY): Payer: Self-pay | Admitting: *Deleted

## 2013-06-23 DIAGNOSIS — O9989 Other specified diseases and conditions complicating pregnancy, childbirth and the puerperium: Secondary | ICD-10-CM

## 2013-06-23 DIAGNOSIS — O99891 Other specified diseases and conditions complicating pregnancy: Secondary | ICD-10-CM | POA: Insufficient documentation

## 2013-06-23 DIAGNOSIS — O479 False labor, unspecified: Secondary | ICD-10-CM | POA: Insufficient documentation

## 2013-06-23 DIAGNOSIS — R51 Headache: Secondary | ICD-10-CM | POA: Insufficient documentation

## 2013-06-23 DIAGNOSIS — O26893 Other specified pregnancy related conditions, third trimester: Secondary | ICD-10-CM

## 2013-06-23 DIAGNOSIS — M549 Dorsalgia, unspecified: Secondary | ICD-10-CM | POA: Insufficient documentation

## 2013-06-23 LAB — COMPREHENSIVE METABOLIC PANEL
ALT: 6 U/L (ref 0–35)
AST: 16 U/L (ref 0–37)
Alkaline Phosphatase: 99 U/L (ref 39–117)
CO2: 20 mEq/L (ref 19–32)
Calcium: 9.1 mg/dL (ref 8.4–10.5)
Chloride: 104 mEq/L (ref 96–112)
GFR calc Af Amer: >90 mL/min (ref >90)
GFR calc non Af Amer: >90 mL/min (ref >90)
Glucose, Bld: 89 mg/dL (ref 70–99)
Sodium: 135 mEq/L (ref 135–145)
Total Bilirubin: 0.2 mg/dL — ABNORMAL LOW (ref 0.3–1.2)

## 2013-06-23 LAB — CBC
Hemoglobin: 10.7 g/dL — ABNORMAL LOW (ref 12.0–15.0)
MCH: 27.8 pg (ref 26.0–34.0)
Platelets: 325 10*3/uL (ref 150–400)
RBC: 3.85 MIL/uL — ABNORMAL LOW (ref 3.87–5.11)
WBC: 9.3 10*3/uL (ref 4.0–10.5)

## 2013-06-23 LAB — URINALYSIS, ROUTINE W REFLEX MICROSCOPIC
Bilirubin Urine: NEGATIVE
Glucose, UA: NEGATIVE mg/dL
Hgb urine dipstick: NEGATIVE
Protein, ur: NEGATIVE mg/dL
Urobilinogen, UA: 2 mg/dL — ABNORMAL HIGH (ref 0.0–1.0)

## 2013-06-23 MED ORDER — BUTALBITAL-APAP-CAFFEINE 50-325-40 MG PO TABS
2.0000 | ORAL_TABLET | Freq: Once | ORAL | Status: AC
  Administered 2013-06-23: 2 via ORAL
  Filled 2013-06-23: qty 2

## 2013-06-23 MED ORDER — ONDANSETRON 4 MG PO TBDP
4.0000 mg | ORAL_TABLET | Freq: Once | ORAL | Status: AC
  Administered 2013-06-23: 4 mg via ORAL
  Filled 2013-06-23: qty 1

## 2013-06-23 NOTE — MAU Provider Note (Signed)
History     CSN: [id number]  Arrival date and time: 06/23/13 1901   First Provider Initiated Contact with Patient 06/23/13 2152      Chief Complaint  Patient presents with  . Contractions   HPI [name] is a 27yo [id number] at 37.4wks who presents for eval of multiple complaints including nausea with ctx, a 'burning' on the back of her left leg when it is stretched out, a 'film' over her eyes (denies blurred vision), and a H/A for which she has not tried anything. She is a pt of the [facility] but has not had a visit since 9/25 due to financial issues (no gas for the car; no minutes on the phone). During the assessment the pt questioned if she could be induced due to her many discomforts.  OB History   Grav Para Term Preterm Abortions TAB SAB Ect Mult Living   7 4 4  2  1 1  4       Past Medical History  Diagnosis Date  . Strep throat   . Ovarian cyst   . Tubal pregnancy 2005  . Umbilical hernia   . Abnormal Pap smear     LEEP    Past Surgical History  Procedure Laterality Date  . Appendectomy    . Laparoscopy for ectopic pregnancy      right tube removed  . Leep      Family History  Problem Relation Age of Onset  . Alcohol abuse Neg Hx   . Arthritis Neg Hx   . Birth defects Neg Hx   . Cancer Neg Hx   . Depression Neg Hx   . Drug abuse Neg Hx   . Early death Neg Hx   . Hearing loss Neg Hx   . Heart disease Neg Hx   . Hyperlipidemia Neg Hx   . Kidney disease Neg Hx   . Learning disabilities Neg Hx   . Mental illness Neg Hx   . Mental retardation Neg Hx   . Miscarriages / Stillbirths Neg Hx   . Stroke Neg Hx   . Vision loss Neg Hx   . Hypertension Mother   . Diabetes Father   . Hypertension Father   . Asthma Father   . Asthma Paternal Aunt   . COPD Paternal Aunt   . Diabetes Paternal Aunt   . Hypertension Paternal Aunt   . Diabetes Paternal Grandmother   . Asthma Paternal Grandmother     History  Substance Use Topics  . Smoking status: Former Smoker -- 0.50  packs/day    Types: Cigarettes    Quit date: 10/13/2012  . Smokeless tobacco: Never Used     Comment: quit with preg  . Alcohol Use: No    Allergies: No Known Allergies  Prescriptions prior to admission  Medication Sig Dispense Refill  . acetaminophen (TYLENOL) 500 MG tablet Take 1,000 mg by mouth every 6 (six) hours as needed for pain.      . calcium carbonate (TUMS - DOSED IN MG ELEMENTAL CALCIUM) 500 MG chewable tablet Chew 2 tablets by mouth 3 (three) times daily as needed for indigestion or heartburn.       . Prenatal Vit-Fe Fumarate-FA (PRENATAL MULTIVITAMIN) TABS Take 1 tablet by mouth every morning.      . ranitidine (ZANTAC) 150 MG tablet Take 1 tablet (150 mg total) by mouth 2 (two) times daily.  60 tablet  1    ROS Physical Exam   Blood pressure 116/74, pulse  91, temperature 98.4 F (36.9 C), resp. rate 20, height 5\' 7"  (1.702 m), weight 75.66 kg (166 lb 12.8 oz), last menstrual period 10/03/2012.  Initial BP 145/83  Physical Exam  Constitutional: She is oriented to person, place, and time. She appears well-developed.  HENT:  Head: Normocephalic.  Neck: Normal range of motion.  Cardiovascular: Normal rate.   Respiratory: Effort normal.  GI:  EFM: 120-130, +accels, no decels Toco: irreg ctx 5-8 mins, mild  Genitourinary:  Cx remains unchanged over exams 1+ hours apart- 2/70/-2  Musculoskeletal: Normal range of motion.  Neg Homan's, BLE equal in size and without redness, 1+ edema on ankles  Neurological: She is alert and oriented to person, place, and time.  Skin: Skin is warm and dry.  Psychiatric: She has a normal mood and affect. Her behavior is normal. Thought content normal.   CBC    Component Value Date/Time   WBC 9.3 06/23/2013 2158   RBC 3.85* 06/23/2013 2158   HGB 10.7* 06/23/2013 2158   HCT 32.5* 06/23/2013 2158   PLT 325 06/23/2013 2158   MCV 84.4 06/23/2013 2158   MCH 27.8 06/23/2013 2158   MCHC 32.9 06/23/2013 2158   RDW 14.4 06/23/2013 2158    LYMPHSABS 1.6 03/08/2013 1012   MONOABS 0.5 03/08/2013 1012   EOSABS 0.6 03/08/2013 1012   BASOSABS 0.0 03/08/2013 1012   CMP     Component Value Date/Time   NA 135 06/23/2013 2158   K 3.4* 06/23/2013 2158   CL 104 06/23/2013 2158   CO2 20 06/23/2013 2158   GLUCOSE 89 06/23/2013 2158   BUN 9 06/23/2013 2158   CREATININE 0.50 06/23/2013 2158   CALCIUM 9.1 06/23/2013 2158   PROT 6.4 06/23/2013 2158   ALBUMIN 2.7* 06/23/2013 2158   AST 16 06/23/2013 2158   ALT 6 06/23/2013 2158   ALKPHOS 99 06/23/2013 2158   BILITOT 0.2* 06/23/2013 2158   GFRNONAA >90 06/23/2013 2158   GFRAA >90 06/23/2013 2158   Urinalysis    Component Value Date/Time   COLORURINE YELLOW 06/23/2013 2120   APPEARANCEUR CLEAR 06/23/2013 2120   LABSPEC 1.025 06/23/2013 2120   PHURINE 6.0 06/23/2013 2120   GLUCOSEU NEGATIVE 06/23/2013 2120   HGBUR NEGATIVE 06/23/2013 2120   BILIRUBINUR NEGATIVE 06/23/2013 2120   KETONESUR NEGATIVE 06/23/2013 2120   PROTEINUR NEGATIVE 06/23/2013 2120   UROBILINOGEN 2.0* 06/23/2013 2120   NITRITE NEGATIVE 06/23/2013 2120   LEUKOCYTESUR NEGATIVE 06/23/2013 2120      MAU Course  Procedures  MDM Given Fiorcet #2 while in MAU with good relief of her H/A; also given Zofran with resolution of nausea. Labs collected due to sl elevated initial BP (145/83), and complaint of H/A.  Assessment and Plan  IUP at 37.4wks H/A- resolved Leg pain- no evidence of DVT  D/C home with labor/ROM/bldg precautions and comfort tips for legs Msg sent to clinic with updated phone number [phone]) to get a visit scheduled  SHAW, KIMBERLY 06/23/2013, 10:20 PM

## 2013-06-23 NOTE — MAU Note (Signed)
Contractions for couple days. Pelvic pressure. Legs swollen

## 2013-06-24 NOTE — MAU Provider Note (Signed)
Chart reviewed and agree with management and plan.  

## 2013-06-25 ENCOUNTER — Inpatient Hospital Stay (HOSPITAL_COMMUNITY)
Admission: AD | Admit: 2013-06-25 | Discharge: 2013-06-27 | DRG: 775 | Disposition: A | Discharge status: Home / Self Care | Payer: Self-pay | Source: Ambulatory Visit | Attending: Obstetrics & Gynecology | Admitting: Obstetrics & Gynecology

## 2013-06-25 ENCOUNTER — Encounter (HOSPITAL_COMMUNITY): Payer: Self-pay | Admitting: Anesthesiology

## 2013-06-25 ENCOUNTER — Encounter (HOSPITAL_COMMUNITY): Payer: Self-pay | Admitting: *Deleted

## 2013-06-25 ENCOUNTER — Inpatient Hospital Stay (HOSPITAL_COMMUNITY): Payer: Self-pay | Admitting: Anesthesiology

## 2013-06-25 DIAGNOSIS — O4290 Premature rupture of membranes, unspecified as to length of time between rupture and onset of labor, unspecified weeks of gestation: Principal | ICD-10-CM | POA: Diagnosis present

## 2013-06-25 LAB — CBC
HCT: 33.2 % — ABNORMAL LOW (ref 36.0–46.0)
Hemoglobin: 10.9 g/dL — ABNORMAL LOW (ref 12.0–15.0)
MCH: 27.8 pg (ref 26.0–34.0)
MCV: 84.7 fL (ref 78.0–100.0)
Platelets: 324 10*3/uL (ref 150–400)
RBC: 3.92 MIL/uL (ref 3.87–5.11)
WBC: 7.8 10*3/uL (ref 4.0–10.5)

## 2013-06-25 MED ORDER — LIDOCAINE HCL (PF) 1 % IJ SOLN
30.0000 mL | INTRAMUSCULAR | Status: DC | PRN
  Filled 2013-06-25 (×2): qty 30

## 2013-06-25 MED ORDER — FENTANYL 2.5 MCG/ML BUPIVACAINE 1/10 % EPIDURAL INFUSION (WH - ANES)
INTRAMUSCULAR | Status: DC | PRN
  Administered 2013-06-25: 14 mL/h via EPIDURAL

## 2013-06-25 MED ORDER — IBUPROFEN 600 MG PO TABS
600.0000 mg | ORAL_TABLET | Freq: Four times a day (QID) | ORAL | Status: DC | PRN
  Administered 2013-06-25: 600 mg via ORAL
  Filled 2013-06-25: qty 1

## 2013-06-25 MED ORDER — LACTATED RINGERS IV SOLN: 500.0000 mL | INTRAVENOUS | Status: DC | PRN

## 2013-06-25 MED ORDER — LIDOCAINE HCL (PF) 1 % IJ SOLN
INTRAMUSCULAR | Status: DC | PRN
  Administered 2013-06-25 (×2): 4 mL

## 2013-06-25 MED ORDER — FENTANYL 2.5 MCG/ML BUPIVACAINE 1/10 % EPIDURAL INFUSION (WH - ANES)
14.0000 mL/h | INTRAMUSCULAR | Status: DC | PRN
  Filled 2013-06-25: qty 125

## 2013-06-25 MED ORDER — PHENYLEPHRINE 40 MCG/ML (10ML) SYRINGE FOR IV PUSH (FOR BLOOD PRESSURE SUPPORT)
80.0000 ug | PREFILLED_SYRINGE | INTRAVENOUS | Status: DC | PRN
  Filled 2013-06-25: qty 10
  Filled 2013-06-25: qty 2

## 2013-06-25 MED ORDER — OXYTOCIN 40 UNITS IN LACTATED RINGERS INFUSION - SIMPLE MED
62.5000 mL/h | INTRAVENOUS | Status: DC
  Filled 2013-06-25: qty 1000

## 2013-06-25 MED ORDER — FENTANYL CITRATE 0.05 MG/ML IJ SOLN
100.0000 ug | INTRAMUSCULAR | Status: DC | PRN
  Administered 2013-06-25: 100 ug via INTRAVENOUS
  Filled 2013-06-25: qty 2

## 2013-06-25 MED ORDER — DIPHENHYDRAMINE HCL 50 MG/ML IJ SOLN: 12.5000 mg | INTRAMUSCULAR | Status: DC | PRN

## 2013-06-25 MED ORDER — OXYTOCIN 40 UNITS IN LACTATED RINGERS INFUSION - SIMPLE MED
1.0000 m[IU]/min | INTRAVENOUS | Status: DC
  Administered 2013-06-25: 2 m[IU]/min via INTRAVENOUS
  Administered 2013-06-25: 6 m[IU]/min via INTRAVENOUS

## 2013-06-25 MED ORDER — ACETAMINOPHEN 325 MG PO TABS: 650.0000 mg | ORAL_TABLET | ORAL | Status: DC | PRN

## 2013-06-25 MED ORDER — EPHEDRINE 5 MG/ML INJ
10.0000 mg | INTRAVENOUS | Status: DC | PRN
  Filled 2013-06-25: qty 2
  Filled 2013-06-25: qty 4

## 2013-06-25 MED ORDER — ONDANSETRON HCL 4 MG/2ML IJ SOLN: 4.0000 mg | Freq: Four times a day (QID) | INTRAMUSCULAR | Status: DC | PRN

## 2013-06-25 MED ORDER — EPHEDRINE 5 MG/ML INJ
10.0000 mg | INTRAVENOUS | Status: DC | PRN
  Filled 2013-06-25: qty 2

## 2013-06-25 MED ORDER — LACTATED RINGERS IV SOLN
INTRAVENOUS | Status: DC
  Administered 2013-06-25: 11:00:00 via INTRAVENOUS

## 2013-06-25 MED ORDER — PHENYLEPHRINE 40 MCG/ML (10ML) SYRINGE FOR IV PUSH (FOR BLOOD PRESSURE SUPPORT)
80.0000 ug | PREFILLED_SYRINGE | INTRAVENOUS | Status: DC | PRN
  Filled 2013-06-25: qty 2

## 2013-06-25 MED ORDER — FENTANYL 2.5 MCG/ML BUPIVACAINE 1/10 % EPIDURAL INFUSION (WH - ANES): 14.0000 mL/h | INTRAMUSCULAR | Status: DC | PRN

## 2013-06-25 MED ORDER — TERBUTALINE SULFATE 1 MG/ML IJ SOLN: 0.2500 mg | Freq: Once | INTRAMUSCULAR | Status: AC | PRN

## 2013-06-25 MED ORDER — OXYTOCIN BOLUS FROM INFUSION
500.0000 mL | INTRAVENOUS | Status: DC
  Administered 2013-06-25: 500 mL via INTRAVENOUS

## 2013-06-25 MED ORDER — LACTATED RINGERS IV SOLN
500.0000 mL | Freq: Once | INTRAVENOUS | Status: AC
  Administered 2013-06-25: 17:00:00 via INTRAVENOUS

## 2013-06-25 MED ORDER — OXYCODONE-ACETAMINOPHEN 5-325 MG PO TABS: 1.0000 | ORAL_TABLET | ORAL | Status: DC | PRN

## 2013-06-25 MED ORDER — CITRIC ACID-SODIUM CITRATE 334-500 MG/5ML PO SOLN: 30.0000 mL | ORAL | Status: DC | PRN

## 2013-06-25 NOTE — Anesthesia Preprocedure Evaluation (Signed)
Anesthesia Evaluation  Patient identified by MRN, date of birth, ID band Patient awake    Reviewed: Allergy & Precautions, H&P , NPO status , Patient's Chart, lab work & pertinent test results  Airway Mallampati: II TM Distance: >3 FB Neck ROM: Full    Dental   Pulmonary former smoker,          Cardiovascular negative cardio ROS  Rhythm:Regular     Neuro/Psych negative neurological ROS  negative psych ROS   GI/Hepatic negative GI ROS, Neg liver ROS,   Endo/Other  negative endocrine ROS  Renal/GU negative Renal ROS     Musculoskeletal negative musculoskeletal ROS (+)   Abdominal   Peds  Hematology negative hematology ROS (+)   Anesthesia Other Findings   Reproductive/Obstetrics (+) Pregnancy                           Anesthesia Physical Anesthesia Plan  ASA: II  Anesthesia Plan: Epidural   Post-op Pain Management:    Induction:   Airway Management Planned:   Additional Equipment:   Intra-op Plan:   Post-operative Plan:   Informed Consent: I have reviewed the patients History and Physical, chart, labs and discussed the procedure including the risks, benefits and alternatives for the proposed anesthesia with the patient or authorized representative who has indicated his/her understanding and acceptance.     Plan Discussed with:   Anesthesia Plan Comments:         Anesthesia Quick Evaluation

## 2013-06-25 NOTE — Progress Notes (Signed)
Jade Hancock is a 27 y.o. [id number] at [redacted]w[redacted]d admitted for PROM at term.   Subjective: Pt reports some painful contractions, some mild ones, on and off for a few days.  She desires Pitocin to augment labor if she is not in labor without medicine.  Family at bedside for support.   Objective: BP 140/68  Pulse 76  Temp(Src) 97.8 F (36.6 C) (Oral)  Resp 20  LMP 10/03/2012      FHT:  FHR: 135 bpm, variability: moderate,  accelerations:  Present,  decelerations:  Absent UC:   irregular, every 5-10 minutes SVE:   Dilation: 2.5 Effacement (%): 90 Station: -3 Exam by:: Liyana Suniga leftwhich kirby cnm Large amount clear amniotic fluid noted following cervical exam  Labs: Lab Results  Component Value Date   WBC 7.8 06/25/2013   HGB 10.9* 06/25/2013   HCT 33.2* 06/25/2013   MCV 84.7 06/25/2013   PLT 324 06/25/2013    Assessment / Plan: PROM at term  Labor: Early, not active labor.  Plan to start Pitocin to augment labor. Preeclampsia:  n/a Fetal Wellbeing:  Category I Pain Control:  Labor support without medications I/D:  n/a Anticipated MOD:  NSVD  Hancock, Jade Kinsel 06/25/2013, 2:37 PM

## 2013-06-25 NOTE — H&P (Signed)
Attestation of Attending Supervision of Advanced Practitioner (CNM/NP): Evaluation and management procedures were performed by the Advanced Practitioner under my supervision and collaboration. I have reviewed the Advanced Practitioner's note and chart, and I agree with the management and plan.  Chasmine Lender H. 8:46 PM

## 2013-06-25 NOTE — Anesthesia Procedure Notes (Signed)
Epidural Patient location during procedure: OB Start time: 06/25/2013 5:18 PM End time: 06/25/2013 5:28 PM  Staffing Anesthesiologist: Lewie Loron R Performed by: anesthesiologist   Preanesthetic Checklist Completed: patient identified, pre-op evaluation, timeout performed, IV checked, risks and benefits discussed and monitors and equipment checked  Epidural Patient position: sitting Prep: site prepped and draped and DuraPrep Patient monitoring: heart rate, continuous pulse ox and blood pressure Approach: midline Injection technique: LOR saline and LOR air  Needle:  Needle type: Tuohy  Needle gauge: 17 G Needle length: 9 cm Needle insertion depth: 5 cm Catheter type: closed end flexible Catheter size: 19 Gauge Catheter at skin depth: 11 cm Test dose: negative  Assessment Sensory level: T7 Events: blood not aspirated, injection not painful, no injection resistance, negative IV test and no paresthesia  Additional Notes Reason for block:procedure for pain

## 2013-06-25 NOTE — Progress Notes (Signed)
Jade Hancock is a 27 y.o. [id number] at [redacted]w[redacted]d admitted for rupture of membranes  Subjective: Pt comfortable with epidural at this time.  Family at bedside.   Objective: BP 115/78  Pulse 77  Temp(Src) 98 F (36.7 C) (Oral)  Resp 18  Ht 5\' 7"  (1.702 m)  Wt 75.297 kg (166 lb)  BMI 25.99 kg/m2  SpO2 98%  LMP 10/03/2012      FHT:  FHR: 135 bpm, variability: moderate,  accelerations:  Present,  decelerations:  Absent UC:   regular, every 3 minutes SVE:   Dilation: 3.5 Effacement (%): 100 Station: -1;-2 Exam by:: LLeftwhich-kirby,cnm  Labs: Lab Results  Component Value Date   WBC 7.8 06/25/2013   HGB 10.9* 06/25/2013   HCT 33.2* 06/25/2013   MCV 84.7 06/25/2013   PLT 324 06/25/2013    Assessment / Plan: Augmentation of labor, progressing well  Labor: Progressing normally Preeclampsia:  n/a Fetal Wellbeing:  Category I Pain Control:  Epidural I/D:  n/a Anticipated MOD:  NSVD  [name], Ellianah Cordy 06/25/2013, 6:58 PM

## 2013-06-25 NOTE — H&P (Signed)
Jade Hancock is a 27 y.o. female [phone] pt of [facility] @ [redacted]w[redacted]d presenting for rupture of membranes at term.  She reports irregular contractions x2-3 days with some more painful than others. She stood up earlier today and felt a large gush of fluid and soaked through her clothes. She reports good fetal movement, vaginal bleeding, vaginal itching/burning, urinary symptoms, h/a, dizziness, n/v, or fever/chills.    Maternal Medical History:  Reason for admission: Rupture of membranes.  Nausea.  Contractions: Onset was 1-2 hours ago.   Frequency: regular.   Duration is approximately 1 minute.   Perceived severity is mild.    Fetal activity: Perceived fetal activity is normal.   Last perceived fetal movement was within the past hour.    Prenatal complications: no prenatal complications Prenatal Complications - Diabetes: none.    OB History   Grav Para Term Preterm Abortions TAB SAB Ect Mult Living   7 4 4  2  1 1  4      Past Medical History  Diagnosis Date  . Strep throat   . Ovarian cyst   . Tubal pregnancy 2005  . Umbilical hernia   . Abnormal Pap smear     LEEP   Past Surgical History  Procedure Laterality Date  . Appendectomy    . Laparoscopy for ectopic pregnancy      right tube removed  . Leep     Family History: family history includes Asthma in her father, paternal aunt, and paternal grandmother; COPD in her paternal aunt; Diabetes in her father, paternal aunt, and paternal grandmother; Hypertension in her father, mother, and paternal aunt. There is no history of Alcohol abuse, Arthritis, Birth defects, Cancer, Depression, Drug abuse, Early death, Hearing loss, Heart disease, Hyperlipidemia, Kidney disease, Learning disabilities, Mental illness, Mental retardation, Miscarriages / Stillbirths, Stroke, or Vision loss. Social History:  reports that she quit smoking about 8 months ago. Her smoking use included Cigarettes. She smoked 0.50 packs per day. She has never used  smokeless tobacco. She reports that she does not drink alcohol or use illicit drugs.   Prenatal Transfer Tool  Maternal Diabetes: No Genetic Screening: Declined Maternal Ultrasounds/Referrals: Normal Fetal Ultrasounds or other Referrals:  None Maternal Substance Abuse:  No Significant Maternal Medications:  None Significant Maternal Lab Results:  Lab values include: Group B Strep negative Other Comments:  None  Review of Systems  Constitutional: Negative for fever, chills and malaise/fatigue.  Eyes: Negative for blurred vision.  Respiratory: Negative for cough and shortness of breath.   Cardiovascular: Negative for chest pain.  Gastrointestinal: Positive for abdominal pain. Negative for heartburn, nausea and vomiting.  Genitourinary: Negative for dysuria, urgency and frequency.  Musculoskeletal: Negative.   Neurological: Negative for dizziness and headaches.  Psychiatric/Behavioral: Negative for depression.      Blood pressure 128/79, pulse 84, temperature 98.4 F (36.9 C), temperature source Oral, resp. rate 18, last menstrual period 10/03/2012. Maternal Exam:  Uterine Assessment: Contraction strength is mild.  Contraction duration is 60 seconds. Contraction frequency is regular.   Abdomen: Patient reports no abdominal tenderness.   Fetal Exam Fetal Monitor Review: Mode: ultrasound.   Baseline rate: 135.  Variability: moderate (6-25 bpm).   Pattern: accelerations present and no decelerations.    Fetal State Assessment: Category I - tracings are normal.     Physical Exam  Nursing note and vitals reviewed. Constitutional: She is oriented to person, place, and time. She appears well-developed and well-nourished.  Neck: Normal range of motion.  Cardiovascular: Normal rate and regular rhythm.   Respiratory: Effort normal and breath sounds normal.  GI: Soft.  Musculoskeletal: Normal range of motion.  Neurological: She is alert and oriented to person, place, and time.  She has normal reflexes.  Skin: Skin is warm and dry.  Psychiatric: She has a normal mood and affect. Her behavior is normal. Judgment and thought content normal.    Prenatal labs: ABO, Rh: O/POS/-- (07/23 1012) Antibody: NEG (07/23 1012) Rubella: 4.77 (07/23 1012) RPR: NON REAC (07/23 1012)  HBsAg: NEGATIVE (07/23 1012)  HIV: NON REACTIVE (07/23 1012)  GBS: Negative (10/29 0000)  Early 1 hour at 22.6 weeks---106   Assessment/Plan: SROM at term GBS negative  Admit to birthing suites Expectant management Anticipate NSVD   [name], Carlee Vonderhaar 06/25/2013, 12:29 PM

## 2013-06-26 MED ORDER — TRAMADOL HCL 50 MG PO TABS
100.0000 mg | ORAL_TABLET | Freq: Once | ORAL | Status: AC
  Administered 2013-06-26: 100 mg via ORAL
  Filled 2013-06-26: qty 2

## 2013-06-26 MED ORDER — OXYCODONE-ACETAMINOPHEN 5-325 MG PO TABS
1.0000 | ORAL_TABLET | ORAL | Status: DC | PRN
  Administered 2013-06-26 (×3): 2 via ORAL
  Administered 2013-06-26: 1 via ORAL
  Administered 2013-06-26: 2 via ORAL
  Administered 2013-06-26: 1 via ORAL
  Administered 2013-06-27 (×3): 2 via ORAL
  Filled 2013-06-26: qty 2
  Filled 2013-06-26: qty 1
  Filled 2013-06-26 (×3): qty 2
  Filled 2013-06-26: qty 1
  Filled 2013-06-26 (×3): qty 2

## 2013-06-26 MED ORDER — PRENATAL MULTIVITAMIN CH
1.0000 | ORAL_TABLET | Freq: Every day | ORAL | Status: DC
  Administered 2013-06-26: 1 via ORAL
  Filled 2013-06-26: qty 1

## 2013-06-26 MED ORDER — CYCLOBENZAPRINE HCL 5 MG PO TABS
5.0000 mg | ORAL_TABLET | Freq: Two times a day (BID) | ORAL | Status: DC | PRN
  Administered 2013-06-26 – 2013-06-27 (×2): 5 mg via ORAL
  Filled 2013-06-26: qty 1

## 2013-06-26 MED ORDER — SIMETHICONE 80 MG PO CHEW: 80.0000 mg | CHEWABLE_TABLET | ORAL | Status: DC | PRN

## 2013-06-26 MED ORDER — ONDANSETRON HCL 4 MG/2ML IJ SOLN: 4.0000 mg | INTRAMUSCULAR | Status: DC | PRN

## 2013-06-26 MED ORDER — BENZOCAINE-MENTHOL 20-0.5 % EX AERO
1.0000 "application " | INHALATION_SPRAY | CUTANEOUS | Status: DC | PRN
  Administered 2013-06-26: 1 via TOPICAL
  Filled 2013-06-26: qty 56

## 2013-06-26 MED ORDER — WITCH HAZEL-GLYCERIN EX PADS: 1.0000 "application " | MEDICATED_PAD | CUTANEOUS | Status: DC | PRN

## 2013-06-26 MED ORDER — DIBUCAINE 1 % RE OINT: 1.0000 "application " | TOPICAL_OINTMENT | RECTAL | Status: DC | PRN

## 2013-06-26 MED ORDER — ONDANSETRON HCL 4 MG PO TABS: 4.0000 mg | ORAL_TABLET | ORAL | Status: DC | PRN

## 2013-06-26 MED ORDER — IBUPROFEN 600 MG PO TABS
600.0000 mg | ORAL_TABLET | Freq: Four times a day (QID) | ORAL | Status: DC
  Administered 2013-06-26 – 2013-06-27 (×5): 600 mg via ORAL
  Filled 2013-06-26 (×5): qty 1

## 2013-06-26 MED ORDER — SENNOSIDES-DOCUSATE SODIUM 8.6-50 MG PO TABS
2.0000 | ORAL_TABLET | ORAL | Status: DC
  Administered 2013-06-26: 2 via ORAL
  Filled 2013-06-26 (×2): qty 2

## 2013-06-26 MED ORDER — ZOLPIDEM TARTRATE 5 MG PO TABS: 5.0000 mg | ORAL_TABLET | Freq: Every evening | ORAL | Status: DC | PRN

## 2013-06-26 MED ORDER — TETANUS-DIPHTH-ACELL PERTUSSIS 5-2.5-18.5 LF-MCG/0.5 IM SUSP: 0.5000 mL | Freq: Once | INTRAMUSCULAR | Status: DC

## 2013-06-26 MED ORDER — LANOLIN HYDROUS EX OINT: TOPICAL_OINTMENT | CUTANEOUS | Status: DC | PRN

## 2013-06-26 MED ORDER — DIPHENHYDRAMINE HCL 25 MG PO CAPS: 25.0000 mg | ORAL_CAPSULE | Freq: Four times a day (QID) | ORAL | Status: DC | PRN

## 2013-06-26 NOTE — Progress Notes (Signed)
Post Partum Day 1 s/p NSVD Subjective: Reports lower back soreness and abdominal cramping, otherwise no complaints. Up ad lib. Voiding and tolerating PO.  Lochia normal.   Objective: Blood pressure 116/75, pulse 75, temperature 97.9 F (36.6 C), temperature source Oral, resp. rate 18, height 5\' 7"  (1.702 m), weight 75.297 kg (166 lb), last menstrual period 10/03/2012, SpO2 99.00%, unknown if currently breastfeeding.  Physical Exam:  General: alert, cooperative and no distress Lochia: normal flow Chest: CTAB Heart: RRR no m/r/g Abdomen: +BS, soft, nontender,  Uterine Fundus: firm DVT Evaluation: No evidence of DVT seen on physical exam. Extremities: trace edema   Recent Labs  06/23/13 2158 06/25/13 1125  HGB 10.7* 10.9*  HCT 32.5* 33.2*    Assessment/Plan: 1. S/p NSVD. Doing well. Continue routine postpartum care.  2. Delivered late last night so will plan for discharge tomorrow.  3. She is breast feeding.  4. She plans to use Depo provera for postpartum contraception. She would like a BTL but does not yet have Medicaid.    LOS: 1 day   Cowart, Ryann 06/26/2013, 9:44 AM   I have seen and examined this patient and agree with above documentation in the resident's note. Will work with SW to obtain medicate so as to get an interval tubal.   Rulon Abide, M.D. [facility] Fellow 06/26/2013 10:58 AM

## 2013-06-26 NOTE — Progress Notes (Signed)
Ur chart review completed.  

## 2013-06-26 NOTE — Progress Notes (Signed)
Called [facility] due to pt's increased pain in lower pack and pt c/o difficulty walking. MD to see pt.

## 2013-06-26 NOTE — Anesthesia Postprocedure Evaluation (Signed)
  Anesthesia Post-op Note  Patient: Jade Hancock  Procedure(s) Performed: * No procedures listed *  Patient Location: Mother/Baby  Anesthesia Type:Epidural  Level of Consciousness: awake, alert , oriented and patient cooperative  Airway and Oxygen Therapy: Patient Spontanous Breathing  Post-op Pain: moderate lower back pain which pt. Attributes to pushing so hard with delivery.  Told to have nurse contact anesthesia if back pain worsens.  Ambulating without difficulty.  Post-op Assessment: Patient's Cardiovascular Status Stable, Respiratory Function Stable, No headache, No residual numbness and No residual motor weakness  Post-op Vital Signs: stable  Complications: No apparent anesthesia complications

## 2013-06-27 MED ORDER — OXYCODONE-ACETAMINOPHEN 5-325 MG PO TABS: 1.0000 | ORAL_TABLET | ORAL | Status: DC | PRN

## 2013-06-27 MED ORDER — IBUPROFEN 600 MG PO TABS: 600.0000 mg | ORAL_TABLET | Freq: Four times a day (QID) | ORAL | Status: AC

## 2013-06-27 MED ORDER — CYCLOBENZAPRINE HCL 5 MG PO TABS: 5.0000 mg | ORAL_TABLET | Freq: Three times a day (TID) | ORAL | Status: DC | PRN

## 2013-06-27 NOTE — Discharge Summary (Signed)
Obstetric Discharge Summary Reason for Admission: rupture of membranes Prenatal Procedures: none Intrapartum Procedures: spontaneous vaginal delivery and shoulder dystocia for 1:10 resolved with woods screw Postpartum Procedures: none Complications-Operative and Postpartum: none Hemoglobin  Date Value Range Status  06/25/2013 10.9* 12.0 - 15.0 g/dL Final     HCT  Date Value Range Status  06/25/2013 33.2* 36.0 - 46.0 % Final   Physical Exam:  General: alert, cooperative, appears stated age and no distress Lochia: appropriate Uterine Fundus: Firm U-2 DVT Evaluation: No evidence of DVT seen on physical exam. Negative Homan's sign. No cords or calf tenderness. No significant calf/ankle edema. Sensation intact, 4/5 of right leg  Discharge Diagnoses: Term Pregnancy-delivered  Discharge Information: Date: 06/27/2013 Activity: pelvic rest Diet: routine Medications: PNV, Ibuprofen and Vicodin Condition: stable Instructions: refer to practice specific booklet Discharge to: home Follow-up Information   Follow up with [facility] In 4 weeks.   Specialty:  Obstetrics and Gynecology   Contact information:   [address] [place] [address] [phone]      Schedule an appointment as soon as possible for a visit to follow up.      Newborn Data: Live born female  Birth Weight: 8 lb 3.8 oz (3737 g) APGAR: 8, 9  Home with mother.  NSVD over intact perineum complicated with shoulder dystocia resolved with mcroberts, suprapubic pressure and wood's screw. Complicated with pain in right leg with AROM but full PROM. Pt plans to breast feed and requests depo in clinic. No BTL paperwork and may address at follow up.   Tawana Scale 06/27/2013, 7:55 AM

## 2013-06-27 NOTE — Progress Notes (Signed)
Called [facility] due to pt missing discharge medications. Dr. Ike Bene told pt she could have flexeril and percocet on discharge.

## 2013-06-27 NOTE — Discharge Summary (Signed)
Attestation of Attending Supervision of Advanced Practitioner (PA/CNM/NP): Evaluation and management procedures were performed by the Advanced Practitioner under my supervision and collaboration.  I have reviewed the Advanced Practitioner's note and chart, and I agree with the management and plan.  Zeppelin Beckstrand, MD, FACOG Attending Obstetrician & Gynecologist [facility], [facility]  

## 2013-06-29 ENCOUNTER — Other Ambulatory Visit: Payer: Self-pay | Admitting: Obstetrics and Gynecology

## 2013-06-30 ENCOUNTER — Encounter: Payer: Self-pay | Admitting: *Deleted

## 2013-07-25 IMAGING — US US OB TRANSVAGINAL
1 series · 14 of 28 positions shown · non-contrast
Comparison: 11/18/2012

CLINICAL DATA: Subchorionic hemorrhage.

TRANSVAGINAL OBSTETRIC US
TECHNIQUE: Transvaginal ultrasound was performed for complete
evaluation of the gestation as well as the maternal uterus, adnexal
regions, and pelvic cul-de-sac.

[Series 1: us ob transvaginal · 45 acquisitions, 14 frames shown]
[im 2/45]
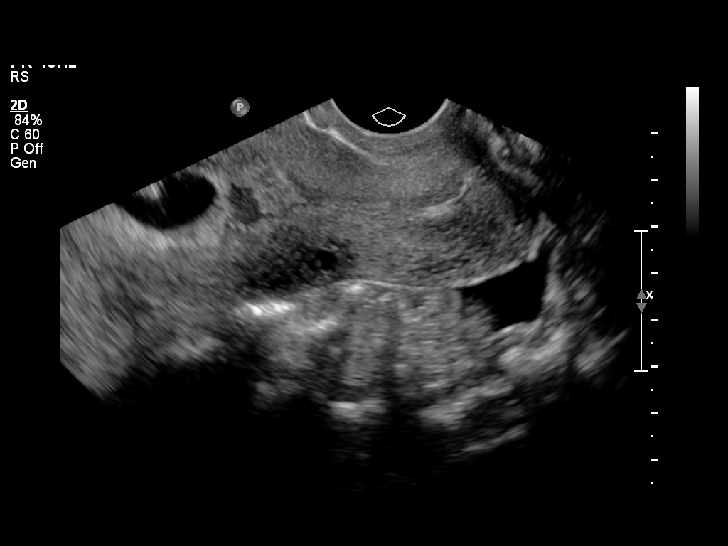
[im 5/45]
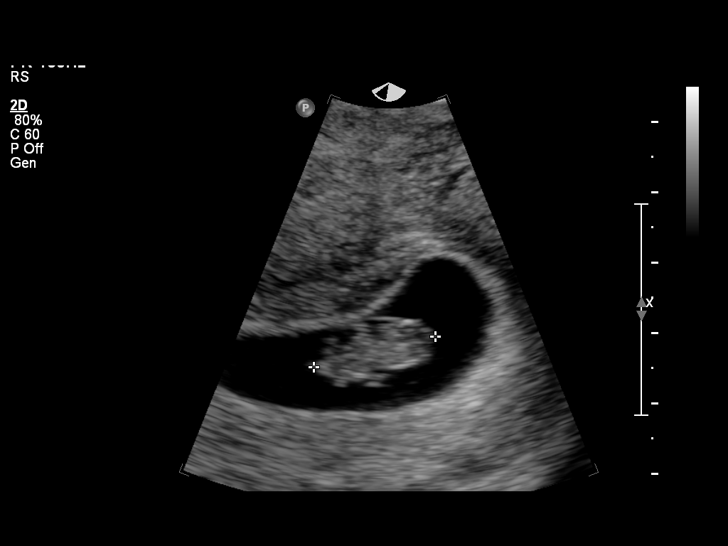
[im 9/45]
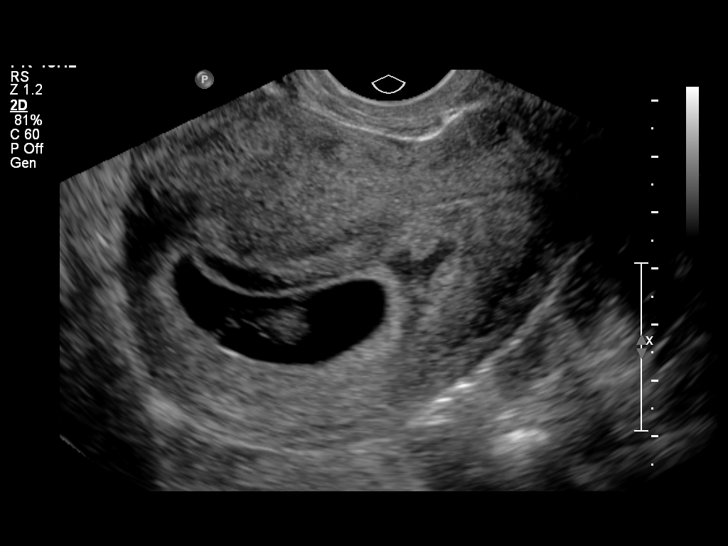
[im 12/45]
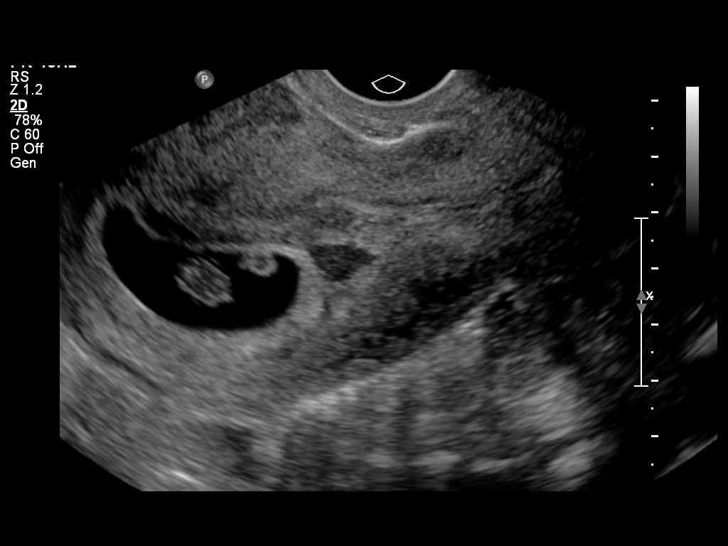
[im 15/45]
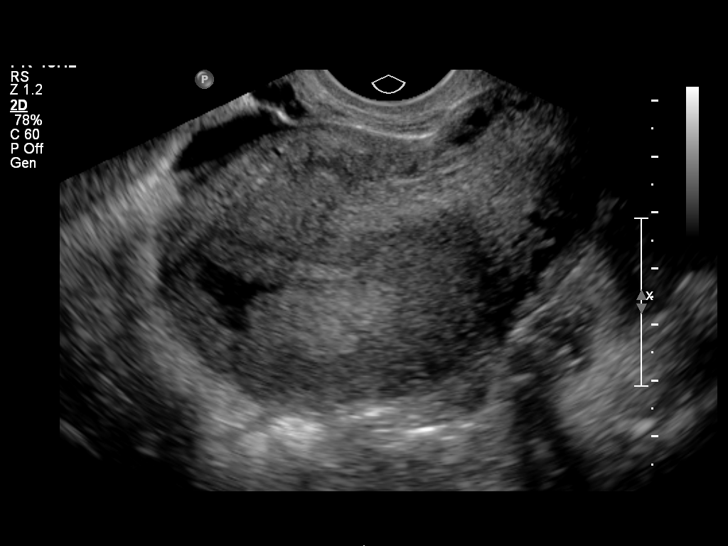
[im 18/45]
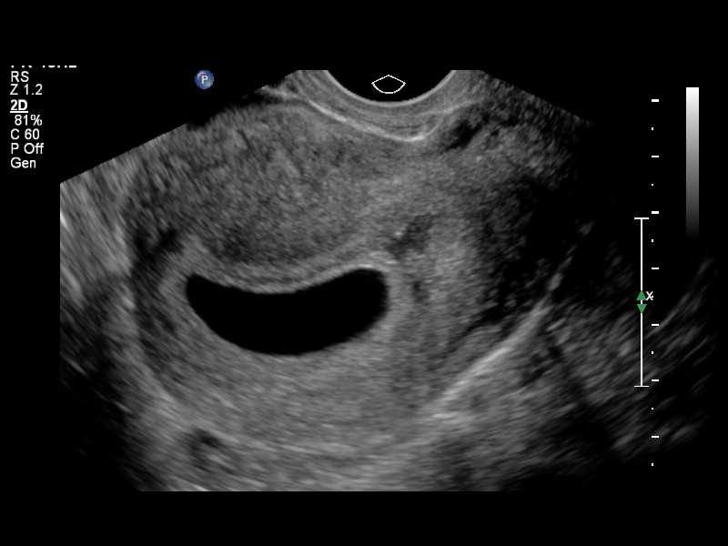
[im 22/45]
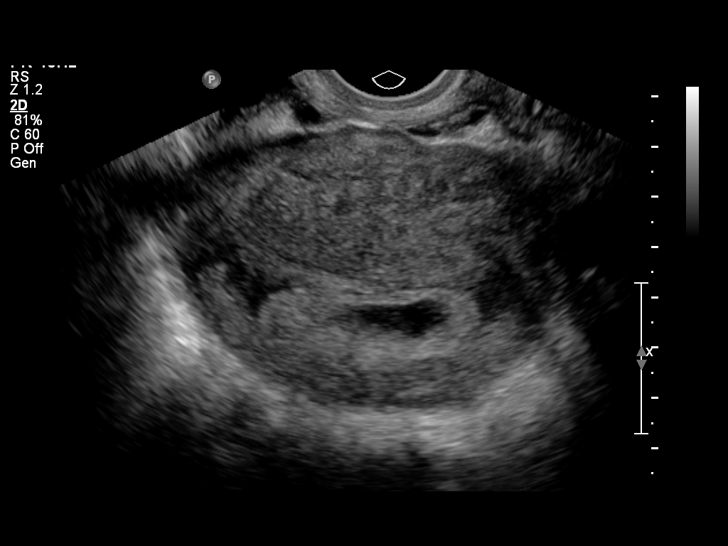
[im 25/45]
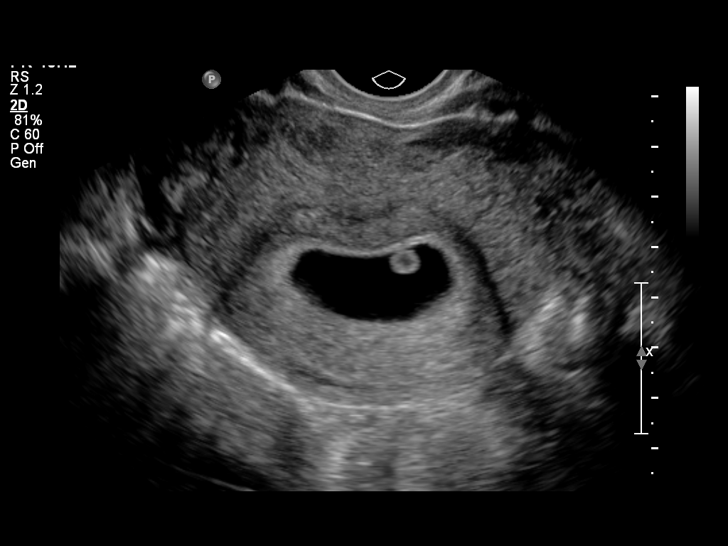
[im 28/45]
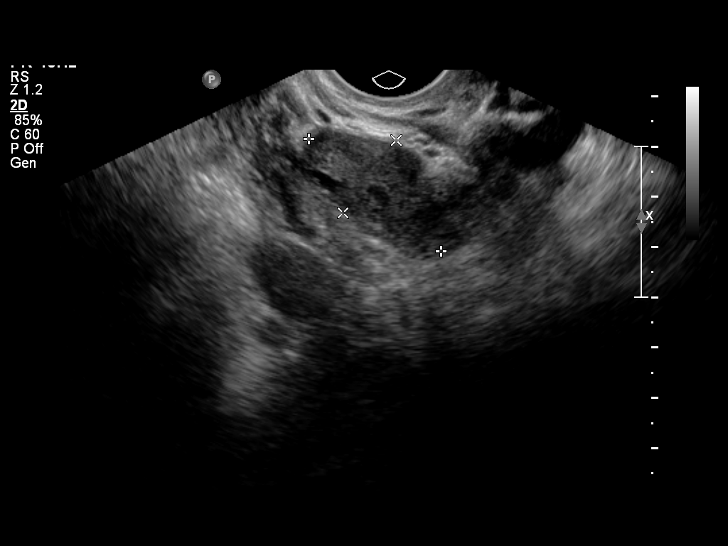
[im 31/45]
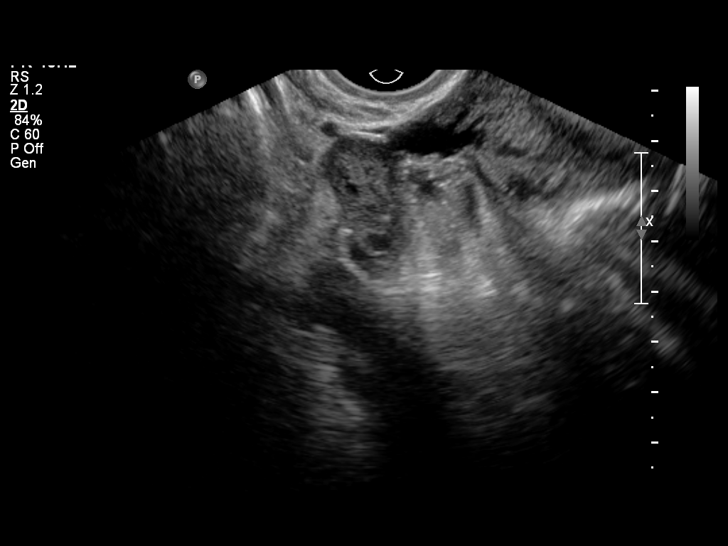
[im 35/45]
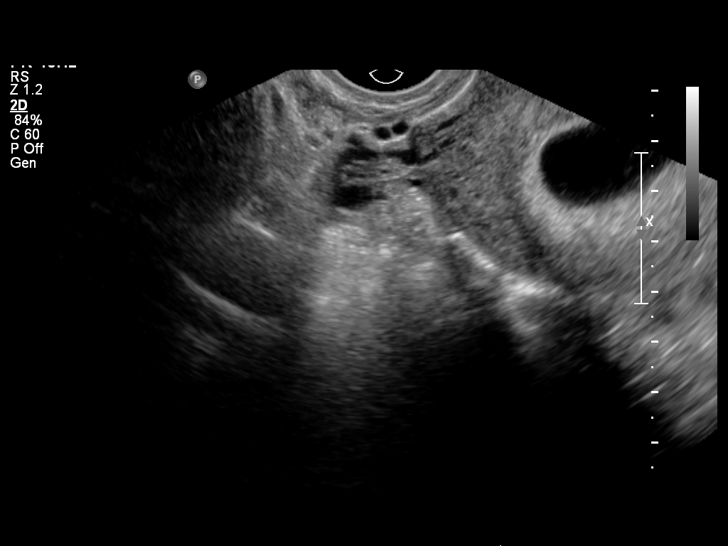
[im 38/45]
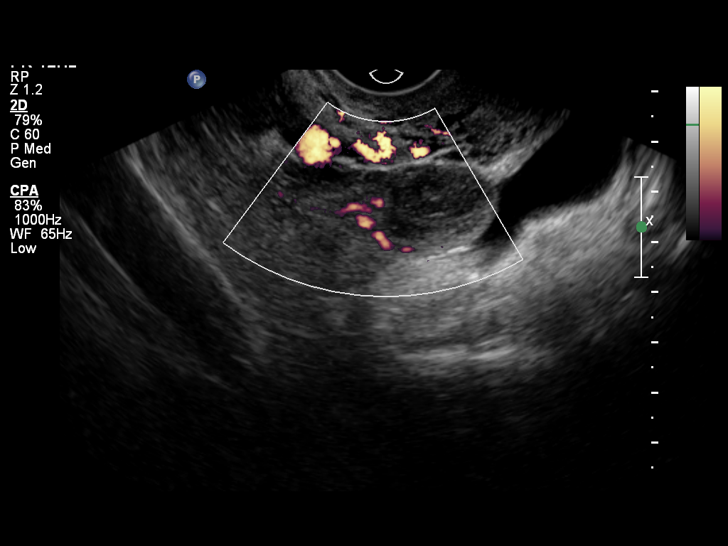
[im 41/45]
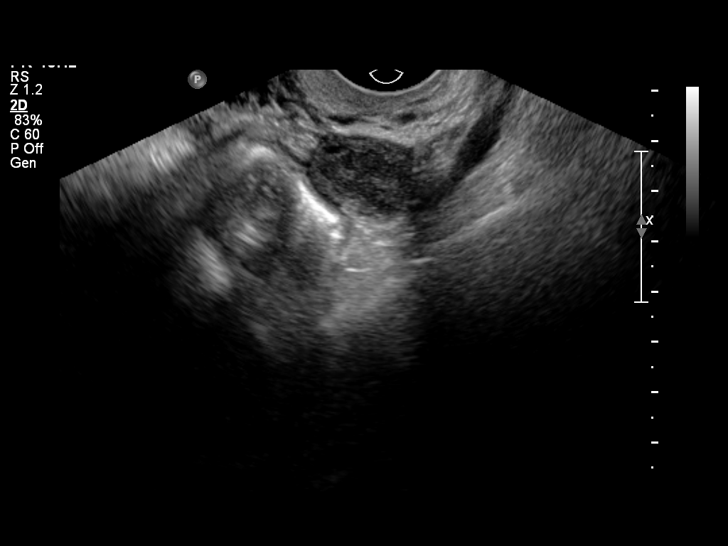
[im 45/45]
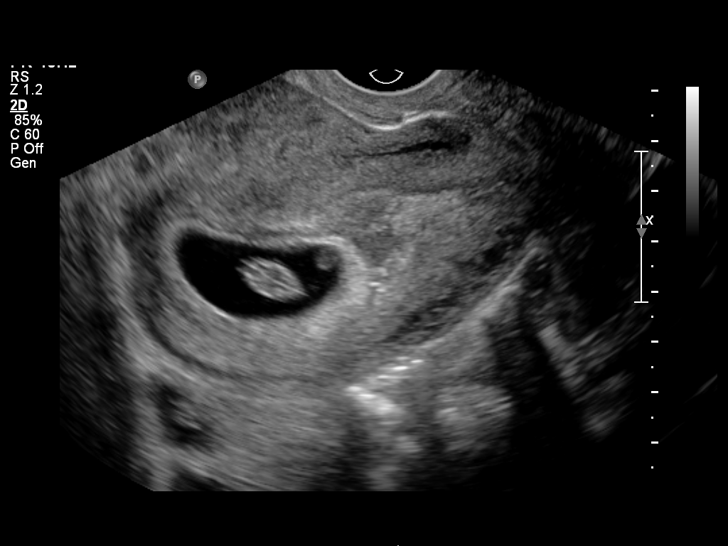

[14 of 28 positions shown; findings below may reference images not displayed]

Intrauterine gestational sac: Single
Yolk sac: Yes
Embryo: Yes
Cardiac Activity: Yes
Heart Rate: 162
CRL: 1.79 cm           8   w  2   d             US EDC: 07/10/2013

Subchorionic hemorrhage: The subchorionic hemorrhage is more
extensive than on the prior study with four measurable areas around
the gestational sac.

The right ovary is normal.  Corpus luteum cyst on the left ovary.
IMPRESSION: The subchorionic hemorrhage is more extensive than on the prior
exam. Normal fetal growth in the interval.

## 2013-07-26 ENCOUNTER — Ambulatory Visit: Payer: Self-pay | Admitting: Obstetrics & Gynecology

## 2013-07-26 ENCOUNTER — Ambulatory Visit: Payer: Self-pay | Admitting: Obstetrics and Gynecology

## 2013-08-04 ENCOUNTER — Ambulatory Visit: Payer: Self-pay | Admitting: Obstetrics & Gynecology

## 2013-11-06 IMAGING — US US OB COMP +14 WK
1 of 2 series · 12 of 28 positions shown · non-contrast
Comparison: none

[Series 1: us ob comp +14 wk · 12 of 68 slices shown]
[im 1/68]
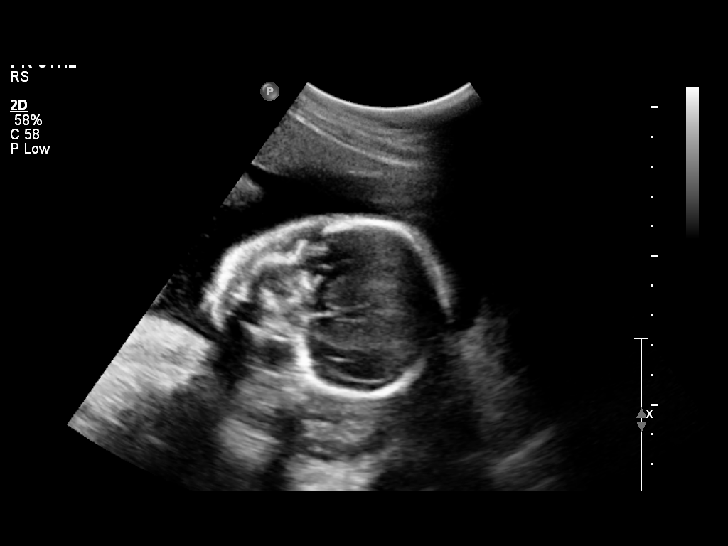
[im 6/68]
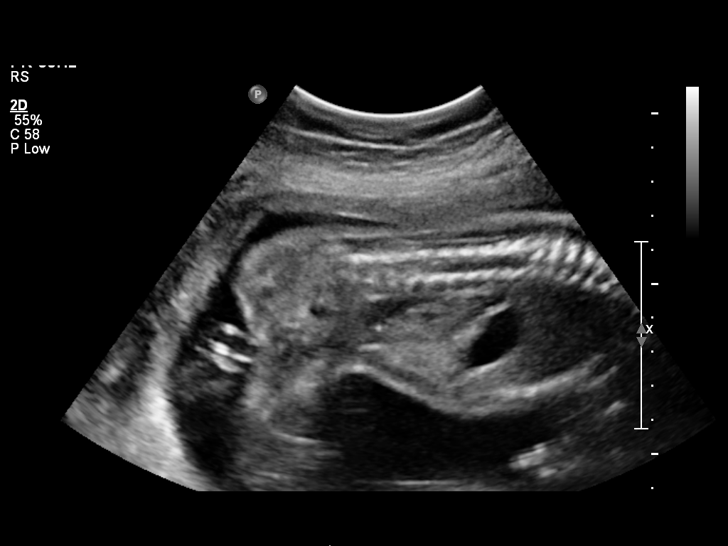
[im 11/68]
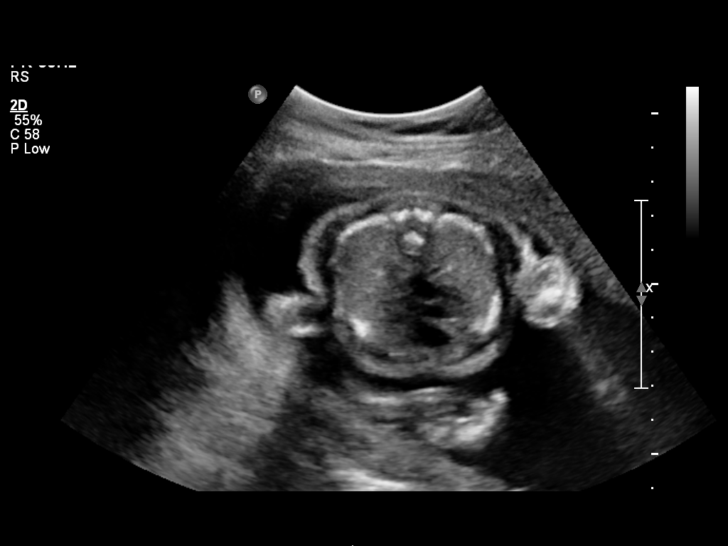
[im 19/68]
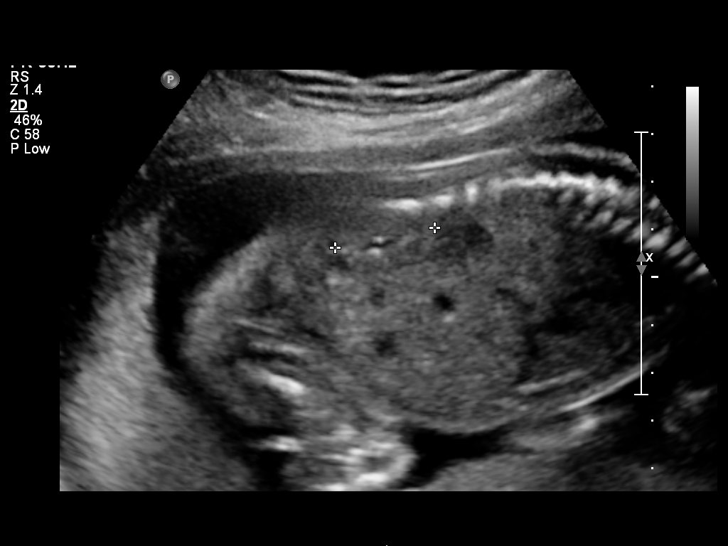
[im 24/68]
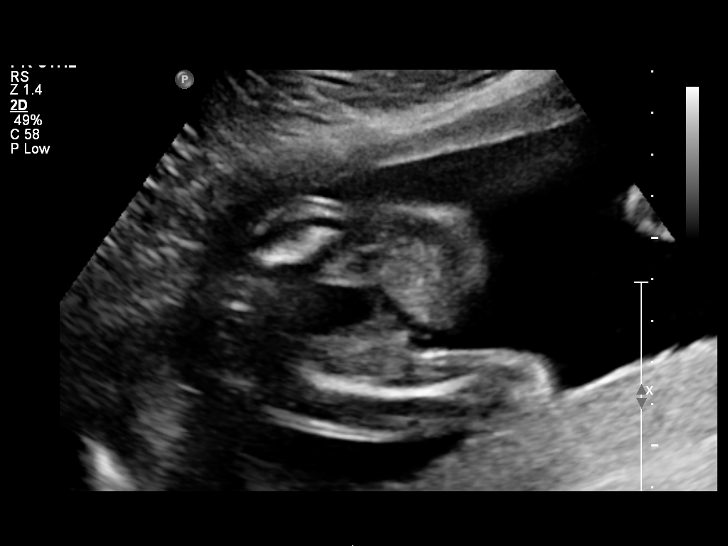
[im 29/68]
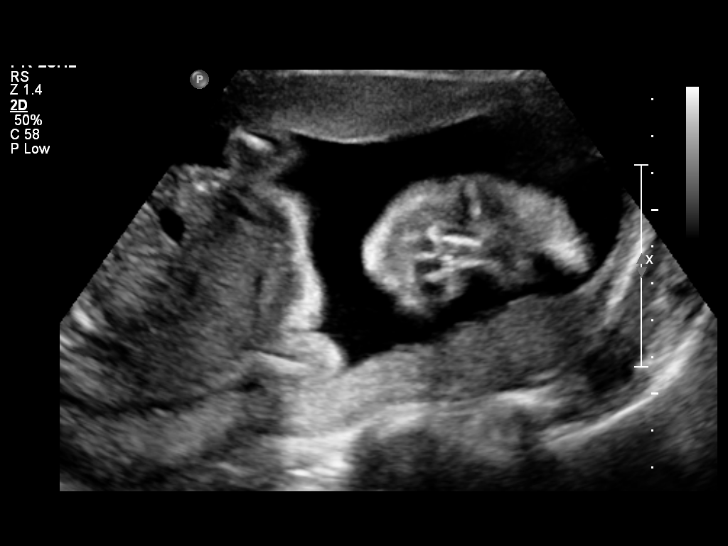
[im 37/68]
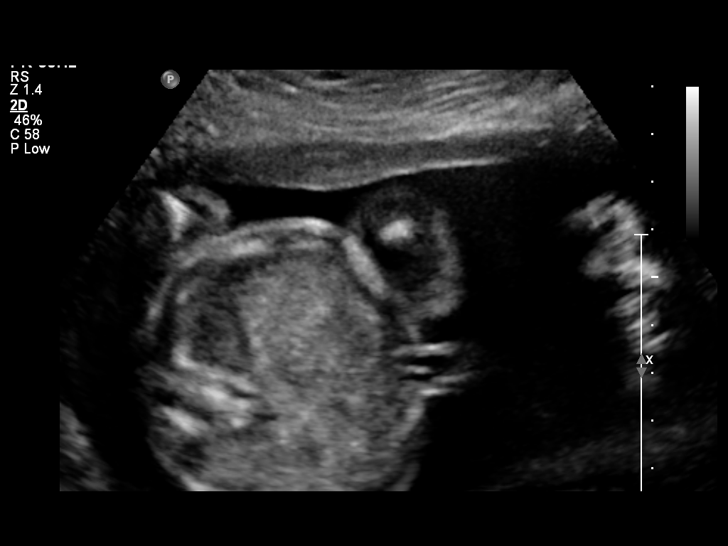
[im 42/68]
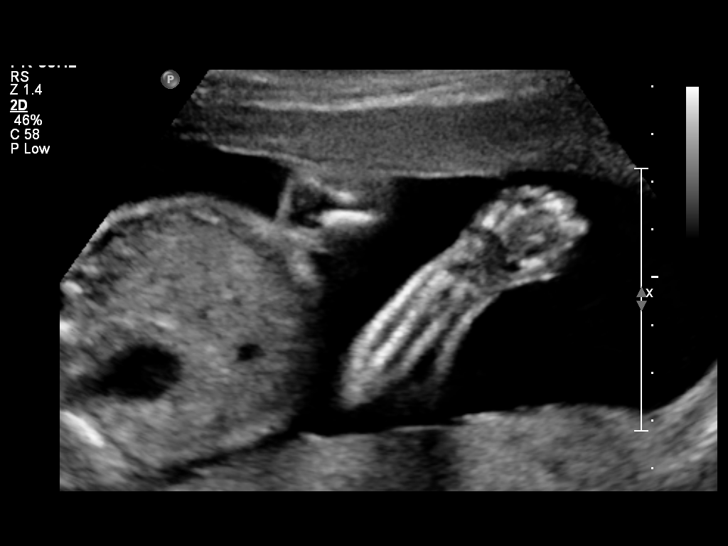
[im 47/68]
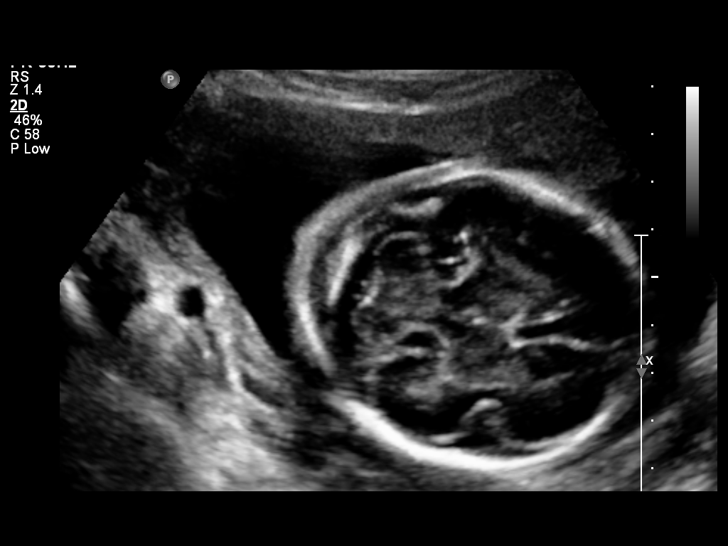
[im 55/68]
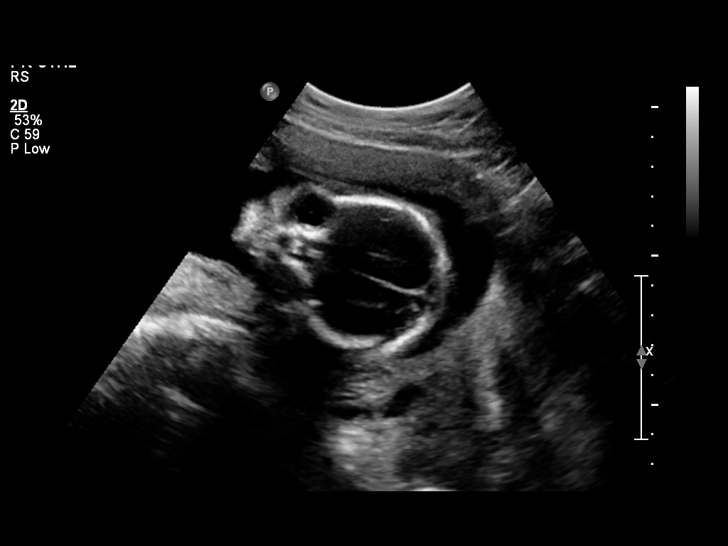
[im 60/68]
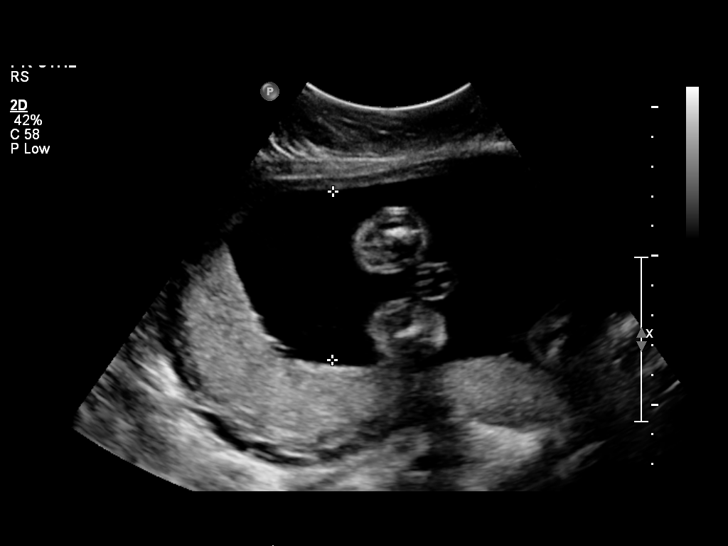
[im 65/68]
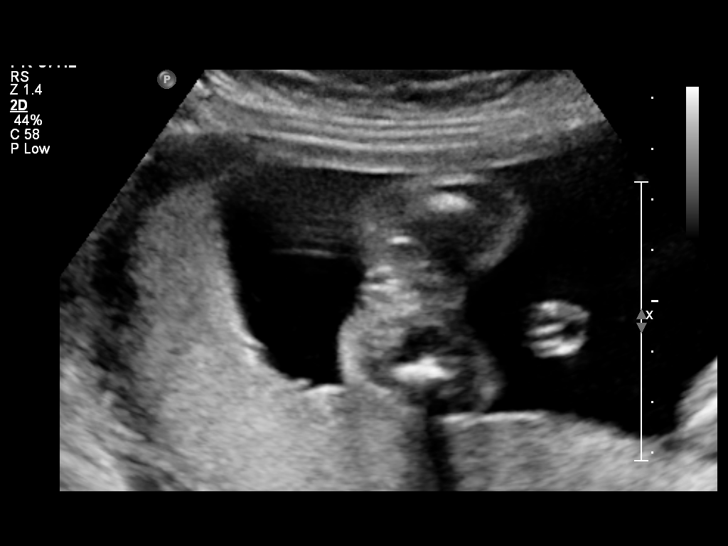

[12 of 28 positions shown; findings below may reference images not displayed]

OBSTETRICS REPORT
                      (Signed Final 03/13/2013 [DATE])

Service(s) Provided

 US OB COMP + 14 WK                                    76805.1
Indications

 No or Little Prenatal Care
 Poor obstetrical history (prior LEEP)
Fetal Evaluation

 Num Of Fetuses:    1
 Fetal Heart Rate:  140                         bpm
 Cardiac Activity:  Observed
 Presentation:      Cephalic
 Placenta:          Posterior, above cervical
                    os
 P. Cord            Visualized
 Insertion:

 Amniotic Fluid
 AFI FV:      Subjectively within normal limits
                                             Larg Pckt:     5.6  cm
Biometry

 BPD:       59  mm    G. Age:   24w 1d                CI:        72.84   70 - 86
                                                      FL/HC:      18.9   19.2 -

 HC:     219.8  mm    G. Age:   24w 0d       84  %    HC/AC:      1.10   1.05 -

 AC:       199  mm    G. Age:   24w 4d       91  %    FL/BPD:     70.3   71 - 87
 FL:      41.5  mm    G. Age:   23w 4d       65  %    FL/AC:      20.9   20 - 24

 Est. FW:     659  gm      1 lb 7 oz     73  %
Gestational Age

 LMP:           23w 0d       Date:   10/03/12                 EDD:   07/10/13
 U/S Today:     24w 0d                                        EDD:   07/03/13
 Best:          22w 5d    Det. By:   Early Ultrasound         EDD:   07/12/13
Anatomy

 Cranium:          Appears normal         Aortic Arch:      Basic anatomy
                                                            exam per order
 Fetal Cavum:      Appears normal         Ductal Arch:      Basic anatomy
                                                            exam per order
 Ventricles:       Appears normal         Diaphragm:        Appears normal
 Choroid Plexus:   Appears normal         Stomach:          Appears normal, left
                                                            sided
 Cerebellum:       Appears normal         Abdomen:          Appears normal
 Posterior Fossa:  Appears normal         Abdominal Wall:   Appears nml (cord
                                                            insert, abd wall)
 Nuchal Fold:      Not applicable (>20    Cord Vessels:     Appears normal (3
                   wks GA)                                  vessel cord)
 Face:             Basic anatomy          Kidneys:          Appear normal
                   exam per order
 Lips:             Appears normal         Bladder:          Appears normal
 Heart:            Appears normal         Spine:            Appears normal
                   (4CH, axis, and
                   situs)
 RVOT:             Appears normal         Lower             Appears normal
                                          Extremities:
 LVOT:             Appears normal         Upper             Appears normal
                                          Extremities:

 Other:  Female gender.
Cervix Uterus Adnexa

 Cervical Length:   3.6       cm

 Cervix:       Normal appearance by transabdominal scan.

 Adnexa:     No abnormality visualized.
Impression

 Single living IUP with assigned GA of 22w 5d. EGA by today's
 ultrasound (24 w 0d) is concordant.
 No fetal anatomic abnormality is identified.
 Normal amniotic fluid volume and cervical length.

## 2014-06-18 ENCOUNTER — Encounter (HOSPITAL_COMMUNITY): Payer: Self-pay | Admitting: *Deleted

## 2015-08-29 ENCOUNTER — Emergency Department
Admission: EM | Admit: 2015-08-29 | Discharge: 2015-08-29 | Disposition: A | Discharge status: Home / Self Care | Payer: Medicaid Other | Attending: Emergency Medicine | Admitting: Emergency Medicine

## 2015-08-29 DIAGNOSIS — B029 Zoster without complications: Secondary | ICD-10-CM | POA: Insufficient documentation

## 2015-08-29 DIAGNOSIS — Z79899 Other long term (current) drug therapy: Secondary | ICD-10-CM | POA: Insufficient documentation

## 2015-08-29 DIAGNOSIS — Z87891 Personal history of nicotine dependence: Secondary | ICD-10-CM | POA: Insufficient documentation

## 2015-08-29 DIAGNOSIS — Z791 Long term (current) use of non-steroidal anti-inflammatories (NSAID): Secondary | ICD-10-CM | POA: Insufficient documentation

## 2015-08-29 MED ORDER — FAMCICLOVIR 500 MG PO TABS: 500.0000 mg | ORAL_TABLET | Freq: Three times a day (TID) | ORAL | Status: AC

## 2015-08-29 MED ORDER — OXYCODONE-ACETAMINOPHEN 5-325 MG PO TABS: 1.0000 | ORAL_TABLET | ORAL | Status: DC | PRN

## 2015-08-29 MED ORDER — OXYCODONE-ACETAMINOPHEN 5-325 MG PO TABS
1.0000 | ORAL_TABLET | Freq: Once | ORAL | Status: AC
  Administered 2015-08-29: 1 via ORAL
  Filled 2015-08-29: qty 1

## 2015-08-29 NOTE — ED Provider Notes (Signed)
[facility] Emergency Department Provider Note  ____________________________________________  Time seen: Approximately 10:55 AM  I have reviewed the triage vital signs and the nursing notes.   HISTORY  Chief Complaint Rash   HPI Jade Hancock is a 30 y.o. female is here with complaint of rash starting last evening. Patient states that she had a burning sensation 4 days ago prior to the outbreak. She [facility] this [facility] who told her to come to the emergency room that she had "shingles". Patient states that the pain began way before she saw the rash and has become severe. Taking over-the-counter medication has not given her any relief.   Past Medical History  Diagnosis Date  . Strep throat   . Ovarian cyst   . Tubal pregnancy 2005  . Umbilical hernia   . Abnormal Pap smear     LEEP    Patient Active Problem List   Diagnosis Date Noted  . Late prenatal care complicating pregnancy in second trimester 03/08/2013  . Umbilical hernia 03/08/2013  . Gastroesophageal reflux in pregancy in second trimester 03/08/2013  . Vaginal bleeding in pregnancy 03/08/2013  . History of loop electrical excision procedure (LEEP) 03/08/2013    Past Surgical History  Procedure Laterality Date  . Appendectomy    . Laparoscopy for ectopic pregnancy      right tube removed  . Leep      Current Outpatient Rx  Name  Route  Sig  Dispense  Refill  . calcium carbonate (TUMS - DOSED IN MG ELEMENTAL CALCIUM) 500 MG chewable tablet   Oral   Chew 2 tablets by mouth 3 (three) times daily as needed for indigestion or heartburn.          . famciclovir (FAMVIR) 500 MG tablet   Oral   Take 1 tablet (500 mg total) by mouth 3 (three) times daily.   21 tablet   0   . ibuprofen (ADVIL,MOTRIN) 600 MG tablet   Oral   Take 1 tablet (600 mg total) by mouth every 6 (six) hours.   30 tablet   0   . oxyCODONE-acetaminophen (PERCOCET) 5-325 MG tablet  Oral   Take 1 tablet by mouth every 4 (four) hours as needed for severe pain.   20 tablet   0   . Prenatal Vit-Fe Fumarate-FA (PRENATAL MULTIVITAMIN) TABS   Oral   Take 1 tablet by mouth every morning.           Allergies Review of patient's allergies indicates no known allergies.  Family History  Problem Relation Age of Onset  . Alcohol abuse Neg Hx   . Arthritis Neg Hx   . Birth defects Neg Hx   . Cancer Neg Hx   . Depression Neg Hx   . Drug abuse Neg Hx   . Early death Neg Hx   . Hearing loss Neg Hx   . Heart disease Neg Hx   . Hyperlipidemia Neg Hx   . Kidney disease Neg Hx   . Learning disabilities Neg Hx   . Mental illness Neg Hx   . Mental retardation Neg Hx   . Miscarriages / Stillbirths Neg Hx   . Stroke Neg Hx   . Vision loss Neg Hx   . Hypertension Mother   . Diabetes Father   . Hypertension Father   . Asthma Father   . Asthma Paternal Aunt   . COPD Paternal Aunt   . Diabetes Paternal  Aunt   . Hypertension Paternal Aunt   . Diabetes Paternal Grandmother   . Asthma Paternal Grandmother     Social History Social History  Substance Use Topics  . Smoking status: Former Smoker -- 0.50 packs/day    Types: Cigarettes    Quit date: 10/13/2012  . Smokeless tobacco: Never Used     Comment: quit with preg  . Alcohol Use: No    Review of Systems Constitutional: No fever/chills ENT: No sore throat. Cardiovascular: Denies chest pain. Respiratory: Denies shortness of breath. Gastrointestinal: No abdominal pain.  No nausea, no vomiting.   Genitourinary: Negative for dysuria. Musculoskeletal: Negative for back pain. Skin: Positive for rash Neurological: Negative for headaches, focal weakness or numbness. Positive for pain  10-point ROS otherwise negative.  ____________________________________________   PHYSICAL EXAM:  VITAL SIGNS: ED Triage Vitals  Enc Vitals Group     BP 08/29/15 1045 125/81 mmHg     Pulse Rate 08/29/15 1045 94     Resp  08/29/15 1045 16     Temp 08/29/15 1047 98.4 F (36.9 C)     Temp Source 08/29/15 1047 Oral     SpO2 08/29/15 1045 99 %     Weight 08/29/15 1036 130 lb (58.968 kg)     Height 08/29/15 1036 5\' 7"  (1.702 m)     Head Cir --      Peak Flow --      Pain Score 08/29/15 1036 10     Pain Loc --      Pain Edu? --      Excl. in GC? --     Constitutional: Alert and oriented. Well appearing and in no acute distress. Eyes: Conjunctivae are normal. PERRL. EOMI. Head: Atraumatic. Nose: No congestion/rhinnorhea. Neck: No stridor.   Hematological/Lymphatic/Immunilogical: Positive left lymph node groin area Cardiovascular: Normal rate, regular rhythm. Grossly normal heart sounds.  Good peripheral circulation. Respiratory: Normal respiratory effort.  No retractions. Lungs CTAB. Gastrointestinal: Soft and nontender. No distention.  Musculoskeletal: No lower extremity tenderness nor edema.  No joint effusions. Neurologic:  Normal speech and language. No gross focal neurologic deficits are appreciated. No gait instability. Skin:  Skin is warm, dry and intact. Erythematous macular rash noted left lumbar area. One erythematous macular patches noted left lateral abdomen with some early vesicles present. No other rashes present at this time. Psychiatric: Mood and affect are normal. Speech and behavior are normal.  ____________________________________________   LABS (all labs ordered are listed, but only abnormal results are displayed)  Labs Reviewed - No data to display   PROCEDURES  Procedure(s) performed: None  Critical Care performed: No  ____________________________________________   INITIAL IMPRESSION / ASSESSMENT AND PLAN / ED COURSE  Pertinent labs & imaging results that were available during my care of the patient were reviewed by me and considered in my medical decision making (see chart for details).  Patient was given a prescription for Famvir along with Percocet as needed for  pain. 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 ____________________________________________   FINAL CLINICAL IMPRESSION(S) / ED DIAGNOSES  Final diagnoses:  Herpes zoster      Tommi Rumps, PA-C 08/29/15 1558  Arnaldo Natal, MD 08/29/15 [phone]

## 2015-08-29 NOTE — ED Notes (Signed)
C/o rash around right flank also has sorea, small knot  To left upper groin area

## 2015-08-29 NOTE — ED Notes (Signed)
Assessment per PA 

## 2015-08-29 NOTE — ED Notes (Signed)
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 Developed a rash and had the inhouse MD look at it. MD reports it is shingles and to come to the ED.

## 2015-08-29 NOTE — Discharge Instructions (Signed)
Shingles Shingles is an infection that causes a painful skin rash and fluid-filled blisters. Shingles is caused by the same virus that causes chickenpox. Shingles only develops in people who:  Have had chickenpox.  Have gotten the chickenpox vaccine. (This is rare.) The first symptoms of shingles may be itching, tingling, or pain in an area on your skin. A rash will follow in a few days or weeks. The rash is usually on one side of the body in a bandlike or beltlike pattern. Over time, the rash turns into fluid-filled blisters that break open, scab over, and dry up. Medicines may:  Help you manage pain.  Help you recover more quickly.  Help to prevent long-term problems. HOME CARE Medicines  Take medicines only as told by your doctor.  Apply an anti-itch or numbing cream to the affected area as told by your doctor. Blister and Rash Care  Take a cool bath or put cool compresses on the area of the rash or blisters as told by your doctor. This may help with pain and itching.  Keep your rash covered with a loose bandage (dressing). Wear loose-fitting clothing.  Keep your rash and blisters clean with mild soap and cool water or as told by your doctor.  Check your rash every day for signs of infection. These include redness, swelling, and pain that lasts or gets worse.  Do not pick your blisters.  Do not scratch your rash. General Instructions  Rest as told by your doctor.  Keep all follow-up visits as told by your doctor. This is important.  Until your blisters scab over, your infection can cause chickenpox in people who have never had it or been vaccinated against it. To prevent this from happening, avoid touching other people or being around other people, especially:  Babies.  Pregnant women.  Children who have eczema.  Elderly people who have transplants.  People who have chronic illnesses, such as leukemia or AIDS. GET HELP IF:  Your pain does not get better with  medicine.  Your pain does not get better after the rash heals.  Your rash looks infected. Signs of infection include:  Redness.  Swelling.  Pain that lasts or gets worse. GET HELP RIGHT AWAY IF:  The rash is on your face or nose.  You have pain in your face, pain around your eye area, or loss of feeling on one side of your face.  You have ear pain or you have ringing in your ear.  You have loss of taste.  Your condition gets worse.   This information is not intended to replace advice given to you by your health care provider. Make sure you discuss any questions you have with your health care provider.   Document Released: 01/20/2008 Document Revised: 08/24/2014 Document Reviewed: 05/15/2014 Elsevier Interactive Patient Education 2016 [employer]    You will  need to see her doctor if any continued pain medication is needed. Percocet is because taken only if you're at home as this may cause drowsiness. Take Famvir as directed.

## 2020-12-17 ENCOUNTER — Emergency Department (INDEPENDENT_AMBULATORY_CARE_PROVIDER_SITE_OTHER)
Admission: EM | Admit: 2020-12-17 | Discharge: 2020-12-17 | Disposition: A | Discharge status: Home / Self Care | Payer: Medicaid Other | Source: Home / Self Care | Attending: Family Medicine | Admitting: Family Medicine

## 2020-12-17 ENCOUNTER — Other Ambulatory Visit: Payer: Self-pay

## 2020-12-17 DIAGNOSIS — S01531A Puncture wound without foreign body of lip, initial encounter: Secondary | ICD-10-CM

## 2020-12-17 MED ORDER — AMOXICILLIN-POT CLAVULANATE 875-125 MG PO TABS: 1.0000 | ORAL_TABLET | Freq: Two times a day (BID) | ORAL | 0 refills | Status: AC

## 2020-12-17 MED ORDER — HYDROCODONE-ACETAMINOPHEN 5-325 MG PO TABS: 1.0000 | ORAL_TABLET | Freq: Three times a day (TID) | ORAL | 0 refills | Status: AC | PRN

## 2020-12-17 MED ORDER — CHLORHEXIDINE GLUCONATE 0.12 % MT SOLN: 15.0000 mL | Freq: Two times a day (BID) | OROMUCOSAL | 0 refills | Status: AC

## 2020-12-17 NOTE — ED Triage Notes (Addendum)
Pt c/o lip injury that happened two hours ago. Swelling of lip noted. Iced after injury. Bleeding controlled.Tylenol right after. Pain 7/10

## 2020-12-17 NOTE — ED Provider Notes (Signed)
[facility]    CSN: [id number] Arrival date & time: 12/17/20  1900      History   Chief Complaint Chief Complaint  Patient presents with  . Lip injury    HPI Jade Hancock is a 35 y.o. female.   She is presenting with a left lip pain.  She hit her lip on an object earlier today.  Since that time she has had pain and swelling of the left upper lip.  It appears to have been a through and through from the wet vermilion border to the dry vermilion border.  HPI  Past Medical History:  Diagnosis Date  . Abnormal Pap smear    LEEP  . Ovarian cyst   . Strep throat   . Tubal pregnancy 2005  . Umbilical hernia     Patient Active Problem List   Diagnosis Date Noted  . Late prenatal care complicating pregnancy in second trimester 03/08/2013  . Umbilical hernia 03/08/2013  . Gastroesophageal reflux in pregancy in second trimester 03/08/2013  . Vaginal bleeding in pregnancy 03/08/2013  . History of loop electrical excision procedure (LEEP) 03/08/2013    Past Surgical History:  Procedure Laterality Date  . APPENDECTOMY    . LAPAROSCOPY FOR ECTOPIC PREGNANCY     right tube removed  . LEEP      OB History    Gravida  7   Para  5   Term  5   Preterm      AB  2   Living  5     SAB  1   IAB      Ectopic  1   Multiple      Live Births  5            Home Medications    Prior to Admission medications   Medication Sig Start Date End Date Taking? Authorizing Provider  amoxicillin-clavulanate (AUGMENTIN) 875-125 MG tablet Take 1 tablet by mouth 2 (two) times daily for 10 days. 12/17/20 12/27/20 Yes Myra Rude, MD  chlorhexidine (PERIDEX) 0.12 % solution Use as directed 15 mLs in the mouth or throat 2 (two) times daily. 12/17/20  Yes Myra Rude, MD  HYDROcodone-acetaminophen (NORCO/VICODIN) 5-325 MG tablet Take 1 tablet by mouth every 8 (eight) hours as needed. 12/17/20  Yes Myra Rude, MD  amphetamine-dextroamphetamine (ADDERALL)  10 MG tablet Take 20 mg by mouth.    [provider]  calcium carbonate (TUMS - DOSED IN MG ELEMENTAL CALCIUM) 500 MG chewable tablet Chew 2 tablets by mouth 3 (three) times daily as needed for indigestion or heartburn.     [provider]  clonazePAM (KLONOPIN) 1 MG tablet Take 1 mg by mouth at bedtime. 12/12/20   [provider]  ibuprofen (ADVIL,MOTRIN) 600 MG tablet Take 1 tablet (600 mg total) by mouth every 6 (six) hours. 06/27/13   Minta Balsam, MD  Prenatal Vit-Fe Fumarate-FA (PRENATAL MULTIVITAMIN) TABS Take 1 tablet by mouth every morning.    [provider]    Family History Family History  Problem Relation Age of Onset  . Alcohol abuse Neg Hx   . Arthritis Neg Hx   . Birth defects Neg Hx   . Cancer Neg Hx   . Depression Neg Hx   . Drug abuse Neg Hx   . Early death Neg Hx   . Hearing loss Neg Hx   . Heart disease Neg Hx   . Hyperlipidemia Neg Hx   .  Kidney disease Neg Hx   . Learning disabilities Neg Hx   . Mental illness Neg Hx   . Mental retardation Neg Hx   . Miscarriages / Stillbirths Neg Hx   . Stroke Neg Hx   . Vision loss Neg Hx   . Hypertension Mother   . Diabetes Father   . Hypertension Father   . Asthma Father   . Asthma Paternal Aunt   . COPD Paternal Aunt   . Diabetes Paternal Aunt   . Hypertension Paternal Aunt   . Diabetes Paternal Grandmother   . Asthma Paternal Grandmother     Social History Social History   Tobacco Use  . Smoking status: Former Smoker    Packs/day: 0.50    Types: Cigarettes    Quit date: 10/13/2012    Years since quitting: 8.1  . Smokeless tobacco: Never Used  . Tobacco comment: quit with preg  Substance Use Topics  . Alcohol use: No  . Drug use: No     Allergies   Patient has no known allergies.   Review of Systems Review of Systems  See HPI  Physical Exam Triage Vital Signs ED Triage Vitals  Enc Vitals Group     BP 12/17/20 1909 110/71     Pulse Rate 12/17/20 1909  91     Resp 12/17/20 1909 18     Temp 12/17/20 1909 98.7 F (37.1 C)     Temp Source 12/17/20 1909 Oral     SpO2 12/17/20 1909 98 %     Weight --      Height --      Head Circumference --      Peak Flow --      Pain Score 12/17/20 1912 7     Pain Loc --      Pain Edu? --      Excl. in GC? --    No data found.  Updated Vital Signs BP 110/71 (BP Location: Right Arm)   Pulse 91   Temp 98.7 F (37.1 C) (Oral)   Resp 18   SpO2 98%   Visual Acuity Right Eye Distance:   Left Eye Distance:   Bilateral Distance:    Right Eye Near:   Left Eye Near:    Bilateral Near:     Physical Exam Gen: NAD, alert, cooperative with exam, well-appearing ENT: Poor dentition.  Appears to have a puncture from the wet vermilion border through to the dry, appears to be a few millimeters and more of a V-pattern. Eye: normal EOM, normal conjunctiva and lids  Neuro: normal tone, normal sensation to touch Psych:  normal insight, alert and oriented           UC Treatments / Results  Labs (all labs ordered are listed, but only abnormal results are displayed) Labs Reviewed - No data to display  EKG   Radiology No results found.  Procedures Procedures (including critical care time)  Medications Ordered in UC Medications - No data to display  Initial Impression / Assessment and Plan / UC Course  I have reviewed the triage vital signs and the nursing notes.  Pertinent labs & imaging results that were available during my care of the patient were reviewed by me and considered in my medical decision making (see chart for details).     [name] is a 34 year old female is presenting with lip pain following a trauma earlier today.  Appears to have a puncture wound within the lip itself.  Not currently bleeding and concern for being infectious given it appears to be through and through.  Provide Augmentin and Peridex.  Provided pain medication to take as needed.  Counseled on supportive  care and given occasional follow-up.  Final Clinical Impressions(s) / UC Diagnoses   Final diagnoses:  Puncture wound of lip, initial encounter     Discharge Instructions     Please use ice on the area  Please eat soft foods and avoid spicy foods  Please take the antibiotic  Please use the pain medicine (norco) as needed  Please follow up if your symptoms fail to improve.     ED Prescriptions    Medication Sig Dispense Auth. Provider   amoxicillin-clavulanate (AUGMENTIN) 875-125 MG tablet Take 1 tablet by mouth 2 (two) times daily for 10 days. 20 tablet Myra Rude, MD   chlorhexidine (PERIDEX) 0.12 % solution Use as directed 15 mLs in the mouth or throat 2 (two) times daily. 120 mL Myra Rude, MD   HYDROcodone-acetaminophen (NORCO/VICODIN) 5-325 MG tablet Take 1 tablet by mouth every 8 (eight) hours as needed. 8 tablet Myra Rude, MD     I have reviewed the PDMP during this encounter.   Myra Rude, MD 12/17/20 2007

## 2020-12-17 NOTE — Discharge Instructions (Signed)
Please use ice on the area  Please eat soft foods and avoid spicy foods  Please take the antibiotic  Please use the pain medicine (norco) as needed  Please follow up if your symptoms fail to improve.
